# Patient Record
Sex: Male | Born: 1985 | Race: White | Hispanic: No | Smoking: Current every day smoker
Health system: Southern US, Community
[De-identification: ages and names within clinical notes are randomized; demographics above are authoritative.]

## PROBLEM LIST (undated history)

## (undated) HISTORY — PX: FEMUR FRACTURE SURGERY: SHX633

## (undated) HISTORY — PX: OTHER SURGICAL HISTORY: SHX169

---

## 2007-04-27 ENCOUNTER — Emergency Department: Payer: Self-pay | Admitting: Internal Medicine

## 2007-04-27 ENCOUNTER — Other Ambulatory Visit: Payer: Self-pay

## 2008-10-09 ENCOUNTER — Emergency Department: Payer: Self-pay | Admitting: Emergency Medicine

## 2009-03-05 ENCOUNTER — Emergency Department: Payer: Self-pay | Admitting: Emergency Medicine

## 2010-06-14 ENCOUNTER — Emergency Department: Payer: Self-pay | Admitting: Internal Medicine

## 2010-08-16 ENCOUNTER — Emergency Department: Payer: Self-pay | Admitting: Emergency Medicine

## 2012-11-21 ENCOUNTER — Emergency Department: Payer: Self-pay | Admitting: Emergency Medicine

## 2013-04-24 ENCOUNTER — Emergency Department: Payer: Self-pay | Admitting: Emergency Medicine

## 2013-05-25 ENCOUNTER — Emergency Department: Payer: Self-pay | Admitting: Emergency Medicine

## 2013-07-04 ENCOUNTER — Emergency Department: Payer: Self-pay | Admitting: Internal Medicine

## 2014-02-13 ENCOUNTER — Emergency Department: Payer: Self-pay | Admitting: Emergency Medicine

## 2014-02-14 ENCOUNTER — Emergency Department: Payer: Self-pay | Admitting: Student

## 2014-10-01 ENCOUNTER — Emergency Department
Admission: EM | Admit: 2014-10-01 | Discharge: 2014-10-01 | Disposition: A | Discharge status: Home / Self Care | Payer: BLUE CROSS/BLUE SHIELD | Attending: Emergency Medicine | Admitting: Emergency Medicine

## 2014-10-01 ENCOUNTER — Encounter: Payer: Self-pay | Admitting: Emergency Medicine

## 2014-10-01 DIAGNOSIS — Z720 Tobacco use: Secondary | ICD-10-CM | POA: Insufficient documentation

## 2014-10-01 DIAGNOSIS — K047 Periapical abscess without sinus: Secondary | ICD-10-CM | POA: Insufficient documentation

## 2014-10-01 DIAGNOSIS — K088 Other specified disorders of teeth and supporting structures: Secondary | ICD-10-CM | POA: Diagnosis present

## 2014-10-01 MED ORDER — IBUPROFEN 800 MG PO TABS: 800.0000 mg | ORAL_TABLET | Freq: Three times a day (TID) | ORAL | Status: DC | PRN

## 2014-10-01 MED ORDER — OXYCODONE-ACETAMINOPHEN 5-325 MG PO TABS: 1.0000 | ORAL_TABLET | ORAL | Status: DC | PRN

## 2014-10-01 MED ORDER — OXYCODONE-ACETAMINOPHEN 5-325 MG PO TABS
ORAL_TABLET | ORAL | Status: AC
  Filled 2014-10-01: qty 1

## 2014-10-01 MED ORDER — OXYCODONE-ACETAMINOPHEN 5-325 MG PO TABS
1.0000 | ORAL_TABLET | Freq: Once | ORAL | Status: AC
  Administered 2014-10-01: 1 via ORAL

## 2014-10-01 MED ORDER — CEFTRIAXONE SODIUM 1 G IJ SOLR
INTRAMUSCULAR | Status: AC
  Filled 2014-10-01: qty 10

## 2014-10-01 MED ORDER — AMOXICILLIN 500 MG PO TABS: 500.0000 mg | ORAL_TABLET | Freq: Three times a day (TID) | ORAL | Status: DC

## 2014-10-01 MED ORDER — LIDOCAINE VISCOUS 2 % MT SOLN: 20.0000 mL | OROMUCOSAL | Status: DC | PRN

## 2014-10-01 MED ORDER — CEFTRIAXONE SODIUM 1 G IJ SOLR
500.0000 mg | Freq: Once | INTRAMUSCULAR | Status: AC
  Administered 2014-10-01: 500 mg via INTRAMUSCULAR

## 2014-10-01 NOTE — ED Notes (Addendum)
Pt reports that he has an abscessed tooth. He also states that he has a fever, states that his calcium is low and that is why his teeth are always rotting out. No swelling seen.

## 2014-10-01 NOTE — ED Provider Notes (Signed)
[facility] Emergency Department Provider Note  ____________________________________________  Time seen:----------------------------------------- 12:26 PM on 10/01/2014 -----------------------------------------    I have reviewed the triage vital signs and the nursing notes.   HISTORY  Chief Complaint Dental Pain    HPI Trevor George is a 29 y.o. male who presents with right lower dental pain. History of calcium deficiency had similar episodes about a year ago. Reports temperature of 103 yesterday. None now temperature sensitive to both heat and cold.Patient states calcium deficiency is the reason for his teeth look in the way to do.   No past medical history on file.  There are no active problems to display for this patient.   No past surgical history on file.  Current Outpatient Rx  Name  Route  Sig  Dispense  Refill  . amoxicillin (AMOXIL) 500 MG tablet   Oral   Take 1 tablet (500 mg total) by mouth 3 (three) times daily.   30 tablet   0   . ibuprofen (ADVIL,MOTRIN) 800 MG tablet   Oral   Take 1 tablet (800 mg total) by mouth every 8 (eight) hours as needed.   30 tablet   0   . lidocaine (XYLOCAINE) 2 % solution   Mouth/Throat   Use as directed 20 mLs in the mouth or throat as needed for mouth pain.   100 mL   0   . oxyCODONE-acetaminophen (ROXICET) 5-325 MG per tablet   Oral   Take 1-2 tablets by mouth every 4 (four) hours as needed for severe pain.   15 tablet   0     Allergies Review of patient's allergies indicates no known allergies.  No family history on file.  Social History History  Substance Use Topics  . Smoking status: Current Every Day Smoker  . Smokeless tobacco: Not on file  . Alcohol Use: Yes    Review of Systems Constitutional: Positive fever/chills ENT: No sore throat. Cardiovascular: Denies chest pain. Respiratory: Denies shortness of breath. Gastrointestinal: No abdominal pain.  No nausea,  no vomiting.  No diarrhea.  No constipation. Musculoskeletal: Negative for back pain. Skin: Negative for rash. Neurological: Negative for headaches, focal weakness or numbness.  10-point ROS otherwise negative.  ____________________________________________   PHYSICAL EXAM:  VITAL SIGNS: ED Triage Vitals  Enc Vitals Group     BP --      Pulse --      Resp --      Temp --      Temp src --      SpO2 --      Weight 10/01/14 1106 141 lb (63.957 kg)     Height 10/01/14 1106 5\' 3"  (1.6 m)     Head Cir --      Peak Flow --      Pain Score 10/01/14 1107 8     Pain Loc --      Pain Edu? --      Excl. in GC? --     Constitutional: Alert and oriented. Well appearing and in no acute distress. Eyes: Conjunctivae are normal. PERRL. EOMI. Head: Atraumatic. Nose: No congestion/rhinnorhea. Mouth/Throat: Mucous membranes are moist.  Oropharynx non-erythematous. Entire mouth filled with dental caries fractured teeth rotting teeth. Neurologic:  Normal speech and language. No gross focal neurologic deficits are appreciated. Speech is normal. No gait instability. Skin:  Skin is warm, dry and intact. No rash noted. Psychiatric: Mood and affect are normal. Speech and behavior are normal.  ____________________________________________  LABS (all labs ordered are listed, but only abnormal results are displayed)  Labs Reviewed - No data to display ____________________________________________  EKG  Not applicable ____________________________________________  RADIOLOGY  Not applicable ____________________________________________   PROCEDURES  Procedure(s) performed: None  Critical Care performed: No  ____________________________________________   INITIAL IMPRESSION / ASSESSMENT AND PLAN / ED COURSE  Pertinent labs & imaging results that were available during my care of the patient were reviewed by me and considered in my medical decision making (see chart for  details).  Discussed findings with patient and course of treatment. Patient will return to the ER if symptoms worsen or indications of an increase abscess noted. Will follow up with dental as directed. ____________________________________________   FINAL CLINICAL IMPRESSION(S) / ED DIAGNOSES  Final diagnoses:  Dental abscess      Evangeline Dakinharles M Markail Diekman, PA-C 10/01/14 1233  Myrna Blazeravid Matthew Schaevitz, MD 10/01/14 [id number]

## 2014-10-01 NOTE — ED Notes (Signed)
Pt d/c but placed on med hold until 1300. Pt made aware and verbalized understanding at this time.

## 2014-10-01 NOTE — Discharge Instructions (Signed)
Dental Abscess °A dental abscess is a collection of infected fluid (pus) from a bacterial infection in the inner part of the tooth (pulp). It usually occurs at the end of the tooth's root.  °CAUSES  °· Severe tooth decay. °· Trauma to the tooth that allows bacteria to enter into the pulp, such as a broken or chipped tooth. °SYMPTOMS  °· Severe pain in and around the infected tooth. °· Swelling and redness around the abscessed tooth or in the mouth or face. °· Tenderness. °· Pus drainage. °· Bad breath. °· Bitter taste in the mouth. °· Difficulty swallowing. °· Difficulty opening the mouth. °· Nausea. °· Vomiting. °· Chills. °· Swollen neck glands. °DIAGNOSIS  °· A medical and dental history will be taken. °· An examination will be performed by tapping on the abscessed tooth. °· X-rays may be taken of the tooth to identify the abscess. °TREATMENT °The goal of treatment is to eliminate the infection. You may be prescribed antibiotic medicine to stop the infection from spreading. A root canal may be performed to save the tooth. If the tooth cannot be saved, it may be pulled (extracted) and the abscess may be drained.  °HOME CARE INSTRUCTIONS °· Only take over-the-counter or prescription medicines for pain, fever, or discomfort as directed by your caregiver. °· Rinse your mouth (gargle) often with salt water (¼ tsp salt in 8 oz [250 ml] of warm water) to relieve pain or swelling. °· Do not drive after taking pain medicine (narcotics). °· Do not apply heat to the outside of your face. °· Return to your dentist for further treatment as directed. °SEEK MEDICAL CARE IF: °· Your pain is not helped by medicine. °· Your pain is getting worse instead of better. °SEEK IMMEDIATE MEDICAL CARE IF: °· You have a fever or persistent symptoms for more than 2-3 days. °· You have a fever and your symptoms suddenly get worse. °· You have chills or a very bad headache. °· You have problems breathing or swallowing. °· You have trouble  opening your mouth. °· You have swelling in the neck or around the eye. °Document Released: 05/10/2005 Document Revised: 02/02/2012 Document Reviewed: 08/18/2010 °ExitCare® Patient Information ©2015 [employer]. This information is not intended to replace advice given to you by your health care provider. Make sure you discuss any questions you have with your health care provider. ° ° °OPTIONS FOR DENTAL FOLLOW UP CARE ° °[place] [employer] - Local Safety Net Dental Clinics °http://www.ncdhhs.gov/dph/oralhealth/services/safetynetclinics.htm °  °[facility] (336-562-3123) ° °[facility] [place] (919-933-9087) ° °[facility] (919-663-1744 ext 237) ° °[facility] (336-570-6415) ° °[facility] (919-968-2025) °This clinic caters to the indigent population and is on a lottery system. °Location: °[facility], [address], [place] °Clinic Hours: °Wednesdays from 6pm - 9pm, patients seen by a lottery system. °For dates, call or go to www.med.[facility].edu/[facility]/patients/Dental-[facility] °Services: °Cleanings, fillings and simple extractions. °Payment Options: °DENTAL WORK IS FREE OF CHARGE. Bring proof of income or support. °Best way to get seen: °Arrive at 5:15 pm - this is a lottery, NOT first come/first serve, so arriving earlier will not increase your chances of being seen. °  °  °[facility] °919-537-3737 °Select option 1 for emergencies °  °Location: °[facility], [address], [place] °Clinic Hours: °No walk-ins accepted - call the day before to schedule an appointment. °Check in times   are 9:30 am and 1:30 pm. °Services: °Simple extractions, temporary fillings, pulpectomy/pulp debridement, uncomplicated abscess drainage. °Payment Options: °PAYMENT IS DUE AT THE TIME OF SERVICE.  Fee is usually $100-200, additional surgical procedures (e.g. abscess drainage) may  be extra. °Cash, checks, Visa/MasterCard accepted.  Can file Medicaid if patient is covered for dental - patient should call case worker to check. °No discount for [facility] patients. °Best way to get seen: °MUST call the day before and get onto the schedule. Can usually be seen the next 1-2 days. No walk-ins accepted. °  °  °[facility] °919-933-9087 °  °Location: °[facility], [address], [place] °Clinic Hours: °M, W, Th, F 8am or 1:30pm, Tues 9a or 1:30 - first come/first served. °Services: °Simple extractions, temporary fillings, uncomplicated abscess drainage.  You do not need to be an [place] resident. °Payment Options: °PAYMENT IS DUE AT THE TIME OF SERVICE. °Dental insurance, otherwise sliding scale - bring proof of income or support. °Depending on income and treatment needed, cost is usually $50-200. °Best way to get seen: °Arrive early as it is first come/first served. °  °  °[facility] °919-542-1641 °  °Location: °[address] °Clinic Hours: °Mon-Thu 8a-5p °Services: °Most basic dental services including extractions and fillings. °Payment Options: °PAYMENT IS DUE AT THE TIME OF SERVICE. °Sliding scale, up to 50% off - bring proof if income or support. °Medicaid with dental option accepted. °Best way to get seen: °Call to schedule an appointment, can usually be seen within 2 weeks OR they will try to see walk-ins - show up at 8a or 2p (you may have to wait). °  °  °[facility] °919-245-2435 °[place] RESIDENTS ONLY °  °Location: °[facility], [address], [place], [place] [address] °Clinic Hours: By appointment only. °Monday - Thursday 8am-5pm, Friday 8am-12pm °Services: Cleanings, fillings, extractions. °Payment Options: °PAYMENT IS DUE AT THE TIME OF SERVICE. °Cash, Visa or MasterCard. Sliding scale - $30 minimum per service. °Best way to get seen: °Come in to  office, complete packet and make an appointment - need proof of income °or support monies for each household member and proof of [place] residence. °Usually takes about a month to get in. °  °  °[facility] °919-956-4038 °  °Location: °[address] [place] °Clinic Hours: Walk-in Urgent Care Dental Services are offered Monday-Friday mornings only. °The numbers of emergencies accepted daily is limited to the number of °providers available. °Maximum 15 - Mondays, Wednesdays & Thursdays °Maximum 10 - Tuesdays & Fridays °Services: °You do not need to be a [place] resident to be seen for a dental emergency. °Emergencies are defined as pain, swelling, abnormal bleeding, or dental trauma. Walkins will receive x-rays if needed. °NOTE: Dental cleaning is not an emergency. °Payment Options: °PAYMENT IS DUE AT THE TIME OF SERVICE. °Minimum co-pay is $40.00 for uninsured patients. °Minimum co-pay is $3.00 for Medicaid with dental coverage. °Dental Insurance is accepted and must be presented at time of visit. °Medicare does not cover dental. °Forms of payment: Cash, credit card, checks. °Best way to get seen: °If not previously registered with the clinic, walk-in dental registration begins at 7:15 am and is on a first come/first serve basis. °If previously registered with the clinic, call to make an appointment. °  °  °[facility] °919-776-4359 °[place] RESIDENTS ONLY °  °Location: °[address],   [place], [place] °Clinic Hours: °Mon-Thu 10a-2p °Services: Extractions only! °Payment Options: °FREE (donations accepted) - bring proof of income or support °Best way to get seen: °Call and schedule an appointment OR come at 8am on the 1st Monday of every month (except for holidays) when it is first come/first served. °  °  °[facility] °919-250-2952 °  °Location: °[address], [place] °Clinic Hours: °Friday mornings °Services, Payment Options, Best way to get  seen: °Call for info °

## 2014-12-04 ENCOUNTER — Encounter: Payer: Self-pay | Admitting: Emergency Medicine

## 2014-12-04 ENCOUNTER — Emergency Department
Admission: EM | Admit: 2014-12-04 | Discharge: 2014-12-04 | Disposition: A | Discharge status: Home / Self Care | Payer: BLUE CROSS/BLUE SHIELD | Attending: Emergency Medicine | Admitting: Emergency Medicine

## 2014-12-04 DIAGNOSIS — G8929 Other chronic pain: Secondary | ICD-10-CM | POA: Diagnosis not present

## 2014-12-04 DIAGNOSIS — Z720 Tobacco use: Secondary | ICD-10-CM | POA: Insufficient documentation

## 2014-12-04 DIAGNOSIS — K088 Other specified disorders of teeth and supporting structures: Secondary | ICD-10-CM | POA: Diagnosis not present

## 2014-12-04 DIAGNOSIS — K089 Disorder of teeth and supporting structures, unspecified: Secondary | ICD-10-CM

## 2014-12-04 DIAGNOSIS — K0889 Other specified disorders of teeth and supporting structures: Secondary | ICD-10-CM

## 2014-12-04 MED ORDER — LIDOCAINE VISCOUS 2 % MT SOLN: OROMUCOSAL | Status: DC

## 2014-12-04 MED ORDER — PENICILLIN V POTASSIUM 500 MG PO TABS: 500.0000 mg | ORAL_TABLET | Freq: Four times a day (QID) | ORAL | Status: DC

## 2014-12-04 MED ORDER — KETOROLAC TROMETHAMINE 10 MG PO TABS: 10.0000 mg | ORAL_TABLET | Freq: Four times a day (QID) | ORAL | Status: DC | PRN

## 2014-12-04 MED ORDER — TRAMADOL HCL 50 MG PO TABS: 50.0000 mg | ORAL_TABLET | Freq: Four times a day (QID) | ORAL | Status: DC | PRN

## 2014-12-04 NOTE — ED Notes (Signed)
States he developed a tooth abscess this week

## 2014-12-04 NOTE — ED Provider Notes (Signed)
[facility] Emergency Department Provider Note  ____________________________________________  Time seen: Approximately 12:11 PM  I have reviewed the triage vital signs and the nursing notes.   HISTORY  Chief Complaint Dental Pain   HPI Trevor George is a 29 y.o. male is here today with complaint of dental pain. He states that he developed "an abscess". He states this is same tooth that is bothering him since May. He did not follow-up with a dentist. Today he states he hasn't point met with a dentist on Friday but cannot remember the dentist's name or where the dentist office is. Currently pain is 5 out of 10. He has not taken anything at home that has helped his pain.   History reviewed. No pertinent past medical history.  There are no active problems to display for this patient.   Past Surgical History  Procedure Laterality Date  . Left knee replacement    . Femur fracture surgery Left     Current Outpatient Rx  Name  Route  Sig  Dispense  Refill  . ketorolac (TORADOL) 10 MG tablet   Oral   Take 1 tablet (10 mg total) by mouth every 6 (six) hours as needed.   12 tablet   0   . lidocaine (XYLOCAINE) 2 % solution      Apply on cotton ball and place on area of dental pain prn   30 mL   0   . penicillin v potassium (VEETID) 500 MG tablet   Oral   Take 1 tablet (500 mg total) by mouth 4 (four) times daily.   28 tablet   0   . traMADol (ULTRAM) 50 MG tablet   Oral   Take 1 tablet (50 mg total) by mouth every 6 (six) hours as needed.   20 tablet   0     Allergies Morphine and related  No family history on file.  Social History History  Substance Use Topics  . Smoking status: Current Every Day Smoker  . Smokeless tobacco: Not on file  . Alcohol Use: Yes    Review of Systems Constitutional: No fever/chills ENT: No sore throat. Cardiovascular: Denies chest pain. Respiratory: Denies shortness of breath. Gastrointestinal:  No abdominal pain.  No nausea, no vomiting. Genitourinary: Negative for dysuria. Musculoskeletal: Negative for back pain. Skin: Negative for rash. Neurological: Negative for headaches  10-point ROS otherwise negative.  ____________________________________________   PHYSICAL EXAM:  VITAL SIGNS: ED Triage Vitals  Enc Vitals Group     BP 12/04/14 1108 117/72 mmHg     Pulse Rate 12/04/14 1108 84     Resp 12/04/14 1108 18     Temp 12/04/14 1108 98 F (36.7 C)     Temp Source 12/04/14 1108 Oral     SpO2 12/04/14 1108 99 %     Weight 12/04/14 1108 169 lb (76.658 kg)     Height 12/04/14 1108 _0  (1.753 m)     Head Cir --      Peak Flow --      Pain Score 12/04/14 1110 5     Pain Loc --      Pain Edu? --      Excl. in [place]? --     Constitutional: Alert and oriented. Well appearing and in no acute distress. Eyes: Conjunctivae are normal. PERRL. EOMI. Head: Atraumatic. Nose: No congestion/rhinnorhea.  Examination of the mouth reveals multiple black teeth especially on the right lower molars. There is no gum edema  or obvious abscess. The enamel is completely eroded from most of his teeth. Neck: No stridor.  Supple Hematological/Lymphatic/Immunilogical: No cervical lymphadenopathy. Cardiovascular: Normal rate, regular rhythm. Grossly normal heart sounds.  Good peripheral circulation. Respiratory: Normal respiratory effort.  No retractions. Lungs CTAB. Gastrointestinal: Soft and nontender. No distention. Musculoskeletal: No lower extremity tenderness nor edema.  No joint effusions. Neurologic:  Normal speech and language. No gross focal neurologic deficits are appreciated. No gait instability. Skin:  Skin is warm, dry and intact. No rash noted. Psychiatric: Mood and affect are normal. Speech and behavior are normal.  ____________________________________________   LABS (all labs ordered are listed, but only abnormal results are displayed)  Labs Reviewed - No data to  display  PROCEDURES  Procedure(s) performed: None  Critical Care performed: No  ____________________________________________   INITIAL IMPRESSION / ASSESSMENT AND PLAN / ED COURSE  Pertinent labs & imaging results that were available during my care of the patient were reviewed by me and considered in my medical decision making (see chart for details).  Patient was given a prescription for Toradol tablets, lidocaine, Pen-Vee K, and Toradol No. 20. Patient is currently just to keep his appointment with his dentist on Friday. ____________________________________________   FINAL CLINICAL IMPRESSION(S) / ED DIAGNOSES  Final diagnoses:  Pain, dental  Chronic dental pain      Johnn Hai, PA-C 12/04/14 1607  Delman Kitten, MD 12/09/14 [phone]

## 2014-12-04 NOTE — Discharge Instructions (Signed)
° °  KEEP YOUR APPOINTMENT THIS Friday WITH YOUR DENTIST

## 2015-01-27 ENCOUNTER — Emergency Department
Admission: EM | Admit: 2015-01-27 | Discharge: 2015-01-27 | Disposition: A | Discharge status: Home / Self Care | Payer: BLUE CROSS/BLUE SHIELD | Attending: Emergency Medicine | Admitting: Emergency Medicine

## 2015-01-27 ENCOUNTER — Encounter: Payer: Self-pay | Admitting: *Deleted

## 2015-01-27 DIAGNOSIS — K047 Periapical abscess without sinus: Secondary | ICD-10-CM | POA: Insufficient documentation

## 2015-01-27 DIAGNOSIS — K029 Dental caries, unspecified: Secondary | ICD-10-CM | POA: Insufficient documentation

## 2015-01-27 DIAGNOSIS — Z720 Tobacco use: Secondary | ICD-10-CM | POA: Insufficient documentation

## 2015-01-27 MED ORDER — TRAMADOL HCL 50 MG PO TABS: 50.0000 mg | ORAL_TABLET | Freq: Two times a day (BID) | ORAL | Status: DC

## 2015-01-27 MED ORDER — PENICILLIN V POTASSIUM 500 MG PO TABS
500.0000 mg | ORAL_TABLET | Freq: Four times a day (QID) | ORAL | Status: DC
  Administered 2015-01-27: 500 mg via ORAL
  Filled 2015-01-27: qty 1

## 2015-01-27 MED ORDER — PENICILLIN V POTASSIUM 500 MG PO TABS: 500.0000 mg | ORAL_TABLET | Freq: Four times a day (QID) | ORAL | Status: DC

## 2015-01-27 MED ORDER — KETOROLAC TROMETHAMINE 10 MG PO TABS: 10.0000 mg | ORAL_TABLET | Freq: Three times a day (TID) | ORAL | Status: DC

## 2015-01-27 MED ORDER — KETOROLAC TROMETHAMINE 60 MG/2ML IM SOLN
60.0000 mg | Freq: Once | INTRAMUSCULAR | Status: AC
  Administered 2015-01-27: 60 mg via INTRAMUSCULAR
  Filled 2015-01-27: qty 2

## 2015-01-27 NOTE — ED Notes (Signed)
Pt complains of right sided mouth/ tooth pain, pt has swelling to right side of face

## 2015-01-27 NOTE — Discharge Instructions (Signed)
Dental Abscess °A dental abscess is a collection of infected fluid (pus) from a bacterial infection in the inner part of the tooth (pulp). It usually occurs at the end of the tooth's root.  °CAUSES  °· Severe tooth decay. °· Trauma to the tooth that allows bacteria to enter into the pulp, such as a broken or chipped tooth. °SYMPTOMS  °· Severe pain in and around the infected tooth. °· Swelling and redness around the abscessed tooth or in the mouth or face. °· Tenderness. °· Pus drainage. °· Bad breath. °· Bitter taste in the mouth. °· Difficulty swallowing. °· Difficulty opening the mouth. °· Nausea. °· Vomiting. °· Chills. °· Swollen neck glands. °DIAGNOSIS  °· A medical and dental history will be taken. °· An examination will be performed by tapping on the abscessed tooth. °· X-rays may be taken of the tooth to identify the abscess. °TREATMENT °The goal of treatment is to eliminate the infection. You may be prescribed antibiotic medicine to stop the infection from spreading. A root canal may be performed to save the tooth. If the tooth cannot be saved, it may be pulled (extracted) and the abscess may be drained.  °HOME CARE INSTRUCTIONS °· Only take over-the-counter or prescription medicines for pain, fever, or discomfort as directed by your caregiver. °· Rinse your mouth (gargle) often with salt water (¼ tsp salt in 8 oz [250 ml] of warm water) to relieve pain or swelling. °· Do not drive after taking pain medicine (narcotics). °· Do not apply heat to the outside of your face. °· Return to your dentist for further treatment as directed. °SEEK MEDICAL CARE IF: °· Your pain is not helped by medicine. °· Your pain is getting worse instead of better. °SEEK IMMEDIATE MEDICAL CARE IF: °· You have a fever or persistent symptoms for more than 2-3 days. °· You have a fever and your symptoms suddenly get worse. °· You have chills or a very bad headache. °· You have problems breathing or swallowing. °· You have trouble  opening your mouth. °· You have swelling in the neck or around the eye. °Document Released: 05/10/2005 Document Revised: 02/02/2012 Document Reviewed: 08/18/2010 °ExitCare® Patient Information ©2015 [employer]. This information is not intended to replace advice given to you by your health care provider. Make sure you discuss any questions you have with your health care provider. ° °Dental Care and Dentist Visits °Dental care supports good overall health. Regular dental visits can also help you avoid dental pain, bleeding, infection, and other more serious health problems in the future. It is important to keep the mouth healthy because diseases in the teeth, gums, and other oral tissues can spread to other areas of the body. Some problems, such as diabetes, heart disease, and pre-term labor have been associated with poor oral health.  °See your dentist every 6 months. If you experience emergency problems such as a toothache or broken tooth, go to the dentist right away. If you see your dentist regularly, you may catch problems early. It is easier to be treated for problems in the early stages.  °WHAT TO EXPECT AT A DENTIST VISIT  °Your dentist will look for many common oral health problems and recommend proper treatment. At your regular dental visit, you can expect: °· Gentle cleaning of the teeth and gums. This includes scraping and polishing. This helps to remove the sticky substance around the teeth and gums (plaque). Plaque forms in the mouth shortly after eating. Over time, plaque hardens   on the teeth as tartar. If tartar is not removed regularly, it can cause problems. Cleaning also helps remove stains.  Periodic X-rays. These pictures of the teeth and supporting bone will help your dentist assess the health of your teeth.  Periodic fluoride treatments. Fluoride is a natural mineral shown to help strengthen teeth. Fluoride treatmentinvolves applying a fluoride gel or varnish to the teeth. It is most  commonly done in children.  Examination of the mouth, tongue, jaws, teeth, and gums to look for any oral health problems, such as:  Cavities (dental caries). This is decay on the tooth caused by plaque, sugar, and acid in the mouth. It is best to catch a cavity when it is small.  Inflammation of the gums caused by plaque buildup (gingivitis).  Problems with the mouth or malformed or misaligned teeth.  Oral cancer or other diseases of the soft tissues or jaws. KEEP YOUR TEETH AND GUMS HEALTHY For healthy teeth and gums, follow these general guidelines as well as your dentist's specific advice:  Have your teeth professionally cleaned at the dentist every 6 months.  Brush twice daily with a fluoride toothpaste.  Floss your teeth daily.  Ask your dentist if you need fluoride supplements, treatments, or fluoride toothpaste.  Eat a healthy diet. Reduce foods and drinks with added sugar.  Avoid smoking. TREATMENT FOR ORAL HEALTH PROBLEMS If you have oral health problems, treatment varies depending on the conditions present in your teeth and gums.  Your caregiver will most likely recommend good oral hygiene at each visit.  For cavities, gingivitis, or other oral health disease, your caregiver will perform a procedure to treat the problem. This is typically done at a separate appointment. Sometimes your caregiver will refer you to another dental specialist for specific tooth problems or for surgery. SEEK IMMEDIATE DENTAL CARE IF:  You have pain, bleeding, or soreness in the gum, tooth, jaw, or mouth area.  A permanent tooth becomes loose or separated from the gum socket.  You experience a blow or injury to the mouth or jaw area. Document Released: 01/20/2011 Document Revised: 08/02/2011 Document Reviewed: 01/20/2011 [facility] Patient Information 2015 [place], [place]. This information is not intended to replace advice given to you by your health care provider. Make sure you discuss any  questions you have with your health care provider.  Take the prescription meds as directed. Take the antibiotic until completely gone.  Follow-up with one of the dental clinics listed below.  OPTIONS FOR DENTAL FOLLOW UP CARE  [place] Department of Health and Human Services - Local Safety Net Dental Clinics TripDoors.com.htm   [facility] [phone])  Sharl Ma [phone])  [facility] [phone])  [facility] [phone])  [facility] [phone]) This clinic caters to the indigent population and is on a lottery system. Location: [employer] [facility], [employer], [address], [place] Clinic Hours: Wednesdays from 6pm - 9pm, patients seen by a lottery system. For dates, call or go to [email or web address] Services: Cleanings, fillings and simple extractions. Payment Options: DENTAL WORK IS FREE OF CHARGE. Bring proof of income or support. Best way to get seen: Arrive at 5:15 pm - this is a lottery, NOT first come/first serve, so arriving earlier will not increase your chances of being seen.     [facility] [phone] Select option 1 for emergencies   Location: [facility], [place], [address], [place] Clinic Hours:  No walk-ins accepted - call the day before to schedule an appointment. Check in times are 9:30 am and 1:30 pm. Services: Simple extractions, temporary fillings, pulpectomy/pulp debridement, uncomplicated abscess drainage. Payment Options: PAYMENT IS DUE AT THE TIME OF SERVICE.  Fee is usually $100-200, additional surgical procedures (e.g. abscess drainage) may be extra. Cash, checks, Visa/MasterCard accepted.  Can file Medicaid if patient is covered for dental - patient should call case worker to check. No discount for [facility]  patients. Best way to get seen: MUST call the day before and get onto the schedule. Can usually be seen the next 1-2 days. No walk-ins accepted.     [facility] [phone]   Location: [facility], [address] [place] Clinic Hours: M, W, Th, F 8am or 1:30pm, Tues 9a or 1:30 - first come/first served. Services: Simple extractions, temporary fillings, uncomplicated abscess drainage.  You do not need to be an [facility] resident. Payment Options: PAYMENT IS DUE AT THE TIME OF SERVICE. Dental insurance, otherwise sliding scale - bring proof of income or support. Depending on income and treatment needed, cost is usually $50-200. Best way to get seen: Arrive early as it is first come/first served.     [facility] [phone]   Location: [address] Clinic Hours: Mon-Thu 8a-5p Services: Most basic dental services including extractions and fillings. Payment Options: PAYMENT IS DUE AT THE TIME OF SERVICE. Sliding scale, up to 50% off - bring proof if income or support. Medicaid with dental option accepted. Best way to get seen: Call to schedule an appointment, can usually be seen within 2 weeks OR they will try to see walk-ins - show up at 8a or 2p (you may have to wait).     [facility] [phone] [place]ID[place] ONLY   Location: [facility], [address] [place], [place] [address] Clinic Hours: By appointment only. Monday - Thursday 8am-5pm, Friday 8am-12pm Services: Cleanings, fillings, extractions. Payment Options: PAYMENT IS DUE AT THE TIME OF SERVICE. Cash, Visa or MasterCard. Sliding scale - $30 minimum per service. Best way to get seen: Come in to office, complete packet and make an appointment - need proof of income or support monies for each household member and proof of [facility] residence. Usually takes about a month to  get in.     [facility] [phone]   Location: [address] [facility] Clinic Hours: Walk-in Urgent Care Dental Services are offered Monday-Friday mornings only. The numbers of emergencies accepted daily is limited to the number of providers available. Maximum 15 - Mondays, Wednesdays & Thursdays Maximum 10 - Tuesdays & Fridays Services: You do not need to be a [facility] resident to be seen for a dental emergency. Emergencies are defined as pain, swelling, abnormal bleeding, or dental trauma. Walkins will receive x-rays if needed. NOTE: Dental cleaning is not an emergency. Payment Options: PAYMENT IS DUE AT THE TIME OF SERVICE. Minimum co-pay is $40.00 for uninsured patients. Minimum co-pay is $3.00 for Medicaid with dental coverage. Dental Insurance is accepted and must be presented at time of visit. Medicare does not cover dental. Forms of payment: Cash, credit card, checks. Best way to get seen: If not previously registered with the clinic, walk-in dental registration begins at 7:15 am and is on a first come/first serve basis. If previously registered with the clinic, call to make an appointment.     [facility] [phone] [place]IDENTS ONLY   Location: [address] [place], [place] Clinic Hours: Mon-Thu 10a-2p Services: Extractions only! Payment Options: FREE (donations accepted) - bring proof of income or support Best way to get seen: Call and schedule an appointment OR come at 8am on the 1st Monday of every month (except for holidays) when it is first come/first served.     [facility] [phone]   Location: [address] [place], [place] Clinic Hours: Friday mornings Services, Payment Options, Best way to get seen: Call for info

## 2015-01-27 NOTE — ED Provider Notes (Signed)
[facility] Emergency Department Provider Note ____________________________________________  Time seen: 1235  I have reviewed the triage vital signs and the nursing notes.  HISTORY  Chief Complaint  Dental Pain  HPI Trevor George is a 29 y.o. male reports to the ED for the second time in as many months for acute dental abscess due to chronic dental decay, gum disease, and gingivitis. He notes over the last 3-4 days he's had increasing swelling to the lower right jaw as well as some pain on that side. He denies any outright fevers, chills, sweats, or nausea. He reports that he is unable to get in to the [facility] dental [facility] due to a 4-year waiting list.  History reviewed. No pertinent past medical history.  There are no active problems to display for this patient.  Past Surgical History  Procedure Laterality Date  . Left knee replacement    . Femur fracture surgery Left     Current Outpatient Rx  Name  Route  Sig  Dispense  Refill  . ketorolac (TORADOL) 10 MG tablet   Oral   Take 1 tablet (10 mg total) by mouth every 8 (eight) hours.   15 tablet   0   . lidocaine (XYLOCAINE) 2 % solution      Apply on cotton ball and place on area of dental pain prn   30 mL   0   . penicillin v potassium (VEETID) 500 MG tablet   Oral   Take 1 tablet (500 mg total) by mouth 4 (four) times daily.   40 tablet   0   . traMADol (ULTRAM) 50 MG tablet   Oral   Take 1 tablet (50 mg total) by mouth 2 (two) times daily.   10 tablet   0    Allergies Morphine and related  No family history on file.  Social History Social History  Substance Use Topics  . Smoking status: Current Every Day Smoker  . Smokeless tobacco: None  . Alcohol Use: Yes   Review of Systems  Constitutional: Negative for fever. Eyes: Negative for visual changes. ENT: Negative for sore throat. Dental pain and swelling as above. Cardiovascular: Negative for chest pain. Respiratory:  Negative for shortness of breath. Gastrointestinal: Negative for abdominal pain, vomiting and diarrhea. Genitourinary: Negative for dysuria. Musculoskeletal: Negative for back pain. Skin: Negative for rash. Neurological: Negative for headaches, focal weakness or numbness. ____________________________________________  PHYSICAL EXAM:  VITAL SIGNS: ED Triage Vitals  Enc Vitals Group     BP 01/27/15 1157 138/72 mmHg     Pulse Rate 01/27/15 1157 61     Resp 01/27/15 1157 18     Temp 01/27/15 1157 98.2 F (36.8 C)     Temp Source 01/27/15 1157 Oral     SpO2 01/27/15 1157 99 %     Weight 01/27/15 1157 173 lb (78.472 kg)     Height 01/27/15 1157  (1.753 m)     Head Cir --      Peak Flow --      Pain Score 01/27/15 1214 8     Pain Loc --      Pain Edu? --      Excl. in GC? --    Constitutional: Alert and oriented. Well appearing and in no distress. Eyes: Conjunctivae are normal. PERRL. Normal extraocular movements. ENT   Head: Normocephalic and atraumatic.   Nose: No congestion/rhinorrhea.   Mouth/Throat: Mucous membranes are moist. Right lower jaw swelling. Global dental  enamel, and dentin decay and gingivitis.    Neck: Supple. No thyromegaly. Hematological/Lymphatic/Immunological: No cervical lymphadenopathy. Cardiovascular: Normal rate, regular rhythm.  Respiratory: Normal respiratory effort. No wheezes/rales/rhonchi. Gastrointestinal: Soft and nontender. No distention. Musculoskeletal: Nontender with normal range of motion in all extremities.  Neurologic:  Normal gait without ataxia. Normal speech and language. No gross focal neurologic deficits are appreciated. Skin:  Skin is warm, dry and intact. No rash noted. Psychiatric: Mood and affect are normal. Patient exhibits appropriate insight and judgment. ____________________________________________  PROCEDURES  Toradol 60 mg IM Pen VK 500 mg PO ____________________________________________  INITIAL  IMPRESSION / ASSESSMENT AND PLAN / ED COURSE  Treatment for acute dental abscess in the presence of chronic dental decay and gingivitis. We will treat with Ultram, pen VK, and ketorolac. Patient is given the dental clinic provider list for further follow-up. ____________________________________________  FINAL CLINICAL IMPRESSION(S) / ED DIAGNOSES  Final diagnoses:  Dental abscess  Dental cavities     Lissa Hoard, PA-C 01/27/15 1410  Jennye Moccasin, MD 01/27/15 1434

## 2015-05-04 ENCOUNTER — Encounter: Payer: Self-pay | Admitting: Emergency Medicine

## 2015-05-04 ENCOUNTER — Emergency Department
Admission: EM | Admit: 2015-05-04 | Discharge: 2015-05-04 | Disposition: A | Discharge status: Home / Self Care | Payer: BLUE CROSS/BLUE SHIELD | Attending: Emergency Medicine | Admitting: Emergency Medicine

## 2015-05-04 DIAGNOSIS — K029 Dental caries, unspecified: Secondary | ICD-10-CM | POA: Insufficient documentation

## 2015-05-04 DIAGNOSIS — F17200 Nicotine dependence, unspecified, uncomplicated: Secondary | ICD-10-CM | POA: Insufficient documentation

## 2015-05-04 MED ORDER — LIDOCAINE VISCOUS 2 % MT SOLN: 20.0000 mL | OROMUCOSAL | Status: DC | PRN

## 2015-05-04 MED ORDER — IBUPROFEN 800 MG PO TABS: 800.0000 mg | ORAL_TABLET | Freq: Three times a day (TID) | ORAL | Status: DC

## 2015-05-04 MED ORDER — AMOXICILLIN 500 MG PO CAPS: 500.0000 mg | ORAL_CAPSULE | Freq: Three times a day (TID) | ORAL | Status: DC

## 2015-05-04 NOTE — ED Notes (Signed)
NAD noted at time of D/C. Pt denies questions or concerns. Pt ambulatory to the lobby at this time.  

## 2015-05-04 NOTE — Discharge Instructions (Signed)
Dental Caries Dental caries is tooth decay. This decay can cause a hole in teeth (cavity) that can get bigger and deeper over time. HOME CARE  Brush and floss your teeth. Do this at least two times a day.  Use a fluoride toothpaste.  Use a mouth rinse if told by your dentist or doctor.  Eat less sugary and starchy foods. Drink less sugary drinks.  Avoid snacking often on sugary and starchy foods. Avoid sipping often on sugary drinks.  Keep regular checkups and cleanings with your dentist.  Use fluoride supplements if told by your dentist or doctor.  Allow fluoride to be applied to teeth if told by your dentist or doctor.   This information is not intended to replace advice given to you by your health care provider. Make sure you discuss any questions you have with your health care provider.   Document Released: 02/17/2008 Document Revised: 05/31/2014 Document Reviewed: 05/12/2012 Elsevier Interactive Patient Education 2016 [employer]   OPTIONS FOR DENTAL FOLLOW UP CARE  [place] Department of Health and Human Services - Local Safety Net Dental Clinics TripDoors.comhttp://www.ncdhhs.gov/dph/oralhealth/services/safetynetclinics.htm   [facility] [phone])  [facility] [phone])  [facility] [phone])  [facility] [phone])  [facility] [phone]) This clinic caters to the indigent population and is on a lottery system. Location: [employer] [facility], [employer], [address], [place] Clinic Hours: Wednesdays from 6pm - 9pm, patients seen by a lottery system. For dates, call or go to [email or web address] Services: Cleanings, fillings and simple extractions. Payment Options: DENTAL WORK IS FREE OF CHARGE. Bring proof of income or support. Best way to get seen: Arrive at 5:15 pm - this is a lottery, NOT first come/first serve, so arriving earlier will not  increase your chances of being seen.     [facility] [phone] Select option 1 for emergencies   Location: [facility], [place], [address], [place] Clinic Hours: No walk-ins accepted - call the day before to schedule an appointment. Check in times are 9:30 am and 1:30 pm. Services: Simple extractions, temporary fillings, pulpectomy/pulp debridement, uncomplicated abscess drainage. Payment Options: PAYMENT IS DUE AT THE TIME OF SERVICE.  Fee is usually $100-200, additional surgical procedures (e.g. abscess drainage) may be extra. Cash, checks, Visa/MasterCard accepted.  Can file Medicaid if patient is covered for dental - patient should call case worker to check. No discount for [facility] patients. Best way to get seen: MUST call the day before and get onto the schedule. Can usually be seen the next 1-2 days. No walk-ins accepted.     [facility] [phone]   Location: [facility], [address], [place] Clinic Hours: M, W, Th, F 8am or 1:30pm, Tues 9a or 1:30 - first come/first served. Services: Simple extractions, temporary fillings, uncomplicated abscess drainage.  You do not need to be an [place] [facility] [place] resident. Payment Options: PAYMENT IS DUE AT THE TIME OF SERVICE. Dental insurance, otherwise sliding scale - bring proof of income or support. Depending on income and treatment needed, cost is usually $50-200. Best way to get seen: Arrive early as it is first come/first served.     [facility] [phone]   Location: [address] Clinic Hours: Mon-Thu 8a-5p Services: Most basic dental services including extractions and fillings. Payment Options: PAYMENT IS DUE AT THE TIME OF SERVICE. Sliding scale, up to  50% off - bring proof if income or support. Medicaid with dental option accepted. Best way to get seen: Call to  schedule an appointment, can usually be seen within 2 weeks OR they will try to see walk-ins - show up at 8a or 2p (you may have to wait).     [facility] [phone] [place]IDENTS ONLY   Location: [facility], [address], [place], [place] [address] Clinic Hours: By appointment only. Monday - Thursday 8am-5pm, Friday 8am-12pm Services: Cleanings, fillings, extractions. Payment Options: PAYMENT IS DUE AT THE TIME OF SERVICE. Cash, Visa or MasterCard. Sliding scale - $30 minimum per service. Best way to get seen: Come in to office, complete packet and make an appointment - need proof of income or support monies for each household member and proof of [facility] residence. Usually takes about a month to get in.     [facility] [phone]   Location: [address] [facility] Clinic Hours: Walk-in Urgent Care Dental Services are offered Monday-Friday mornings only. The numbers of emergencies accepted daily is limited to the number of providers available. Maximum 15 - Mondays, Wednesdays & Thursdays Maximum 10 - Tuesdays & Fridays Services: You do not need to be a [facility] resident to be seen for a dental emergency. Emergencies are defined as pain, swelling, abnormal bleeding, or dental trauma. Walkins will receive x-rays if needed. NOTE: Dental cleaning is not an emergency. Payment Options: PAYMENT IS DUE AT THE TIME OF SERVICE. Minimum co-pay is $40.00 for uninsured patients. Minimum co-pay is $3.00 for Medicaid with dental coverage. Dental Insurance is accepted and must be presented at time of visit. Medicare does not cover dental. Forms of payment: Cash, credit card, checks. Best way to get seen: If not previously registered with the clinic, walk-in dental registration begins at 7:15 am and is on a first come/first serve basis. If previously registered with the clinic, call to make an  appointment.     [facility] [phone] [place] RESIDENTS ONLY   Location: [address], [place], [place] Clinic Hours: Mon-Thu 10a-2p Services: Extractions only! Payment Options: FREE (donations accepted) - bring proof of income or support Best way to get seen: Call and schedule an appointment OR come at 8am on the 1st Monday of every month (except for holidays) when it is first come/first served.     [facility] [phone]   Location: [address] [place], [place] Clinic Hours: Friday mornings Services, Payment Options, Best way to get seen: Call for info   You need to see a dentist in the very near future. Take all of the amoxicillin until completely gone. Ibuprofen as needed for pain and inflammation of the gums.

## 2015-05-04 NOTE — ED Notes (Signed)
Pt reports having abscess tooth.

## 2015-05-04 NOTE — ED Provider Notes (Signed)
[facility] Emergency Department Provider Note  ____________________________________________  Time seen: Approximately 9:52 AM  I have reviewed the triage vital signs and the nursing notes.   HISTORY  Chief Complaint Dental Pain   HPI Trevor George is a 29 y.o. male is here with complaint of abscessed tooth. Patient states that he began approximately 2 days ago. He states that he has not seen a dentist recently this had problems with his teeth for a long time. He is not taking any over-the-counter medication. He denies any fever or chills. He denies any nausea or vomiting.Currently he rates his pain as 10 out of 10.   No past medical history on file.  There are no active problems to display for this patient.   Past Surgical History  Procedure Laterality Date  . Left knee replacement    . Femur fracture surgery Left     Current Outpatient Rx  Name  Route  Sig  Dispense  Refill  . amoxicillin (AMOXIL) 500 MG capsule   Oral   Take 1 capsule (500 mg total) by mouth 3 (three) times daily.   30 capsule   0   . ibuprofen (ADVIL,MOTRIN) 800 MG tablet   Oral   Take 1 tablet (800 mg total) by mouth 3 (three) times daily.   30 tablet   0   . lidocaine (XYLOCAINE) 2 % solution   Mouth/Throat   Use as directed 20 mLs in the mouth or throat every 4 (four) hours as needed for mouth pain (apply to teeth on cotten ball prn pain).   100 mL   0     Allergies Morphine and related  History reviewed. No pertinent family history.  Social History Social History  Substance Use Topics  . Smoking status: Current Every Day Smoker  . Smokeless tobacco: None  . Alcohol Use: Yes    Review of Systems Constitutional: No fever/chills ENT: No sore throat. Positive dental pain Cardiovascular: Denies chest pain. Respiratory: Denies shortness of breath. Gastrointestinal:   No nausea, no vomiting.  Skin: Negative for rash. Neurological: Negative for  headaches, focal weakness or numbness.  10-point ROS otherwise negative.  ____________________________________________   PHYSICAL EXAM:  VITAL SIGNS: ED Triage Vitals  Enc Vitals Group     BP 05/04/15 0926 131/79 mmHg     Pulse Rate 05/04/15 0926 77     Resp 05/04/15 0926 16     Temp 05/04/15 0926 98.1 F (36.7 C)     Temp Source 05/04/15 0926 Oral     SpO2 05/04/15 0926 100 %     Weight --      Height --      Head Cir --      Peak Flow --      Pain Score 05/04/15 0927 10     Pain Loc --      Pain Edu? --      Excl. in GC? --     Constitutional: Alert and oriented. Well appearing and in no acute distress. Eyes: Conjunctivae are normal. PERRL. EOMI. Head: Atraumatic. Nose: No congestion/rhinnorhea. Mouth/Throat: Mucous membranes are moist.  Oropharynx non-erythematous. All teeth are in very poor dentition and extremely poor hygiene. There is some minimal gum swelling and tenderness anterior mandible. Generalized enamel loss and numerous teeth. Neck: No stridor.   Hematological/Lymphatic/Immunilogical: No cervical lymphadenopathy. Cardiovascular: Normal rate, regular rhythm. Grossly normal heart sounds.  Good peripheral circulation. Respiratory: Normal respiratory effort.  No retractions. Lungs CTAB. Gastrointestinal: Soft  and nontender. No distention Musculoskeletal: No lower extremity tenderness nor edema.  No joint effusions. Neurologic:  Normal speech and language. No gross focal neurologic deficits are appreciated. No gait instability. Skin:  Skin is warm, dry and intact. No rash noted. Psychiatric: Mood and affect are normal. Speech and behavior are normal.  ____________________________________________   LABS (all labs ordered are listed, but only abnormal results are displayed)  Labs Reviewed - No data to display  PROCEDURES  Procedure(s) performed: None  Critical Care performed: No  ____________________________________________   INITIAL IMPRESSION /  ASSESSMENT AND PLAN / ED COURSE  Pertinent labs & imaging results that were available during my care of the patient were reviewed by me and considered in my medical decision making (see chart for details).  Patient was encouraged to see the dentist from the list given to him today. He is started on amoxicillin 500 mg 3 times a day along with ibuprofen 800 mg 3 times a day. He was also given lidocaine solution 2% to apply to teeth on Cotten ball as needed for pain. ____________________________________________   FINAL CLINICAL IMPRESSION(S) / ED DIAGNOSES  Final diagnoses:  Pain due to dental caries      Tommi Rumps, PA-C 05/04/15 1437  [name], MD 05/04/15 [phone]

## 2015-09-01 ENCOUNTER — Encounter: Payer: Self-pay | Admitting: Emergency Medicine

## 2015-09-01 ENCOUNTER — Emergency Department
Admission: EM | Admit: 2015-09-01 | Discharge: 2015-09-01 | Disposition: A | Discharge status: Home / Self Care | Payer: BLUE CROSS/BLUE SHIELD | Attending: Emergency Medicine | Admitting: Emergency Medicine

## 2015-09-01 DIAGNOSIS — Z791 Long term (current) use of non-steroidal anti-inflammatories (NSAID): Secondary | ICD-10-CM | POA: Insufficient documentation

## 2015-09-01 DIAGNOSIS — F17210 Nicotine dependence, cigarettes, uncomplicated: Secondary | ICD-10-CM | POA: Diagnosis not present

## 2015-09-01 DIAGNOSIS — L0201 Cutaneous abscess of face: Secondary | ICD-10-CM

## 2015-09-01 DIAGNOSIS — Z792 Long term (current) use of antibiotics: Secondary | ICD-10-CM | POA: Diagnosis not present

## 2015-09-01 MED ORDER — SULFAMETHOXAZOLE-TRIMETHOPRIM 800-160 MG PO TABS: 1.0000 | ORAL_TABLET | Freq: Two times a day (BID) | ORAL | Status: DC

## 2015-09-01 NOTE — ED Provider Notes (Signed)
[facility] Emergency Department Provider Note  ____________________________________________  Time seen: On arrival  I have reviewed the triage vital signs and the nursing notes.   HISTORY  Chief Complaint Abscess    HPI Trevor George is a 30 y.o. male who presents with concern for possible abscess on his face. Patient reports he has always had a small cystic structure under the skin on his right cheek. Recently it has seemed to enlarge somewhat. It is mildly tender. No drainage.    History reviewed. No pertinent past medical history.  There are no active problems to display for this patient.   Past Surgical History  Procedure Laterality Date  . Left knee replacement    . Femur fracture surgery Left     Current Outpatient Rx  Name  Route  Sig  Dispense  Refill  . amoxicillin (AMOXIL) 500 MG capsule   Oral   Take 1 capsule (500 mg total) by mouth 3 (three) times daily.   30 capsule   0   . ibuprofen (ADVIL,MOTRIN) 800 MG tablet   Oral   Take 1 tablet (800 mg total) by mouth 3 (three) times daily.   30 tablet   0   . lidocaine (XYLOCAINE) 2 % solution   Mouth/Throat   Use as directed 20 mLs in the mouth or throat every 4 (four) hours as needed for mouth pain (apply to teeth on cotten ball prn pain).   100 mL   0   . sulfamethoxazole-trimethoprim (BACTRIM DS,SEPTRA DS) 800-160 MG tablet   Oral   Take 1 tablet by mouth 2 (two) times daily.   14 tablet   0     Allergies Morphine and related  History reviewed. No pertinent family history.  Social History Social History  Substance Use Topics  . Smoking status: Current Every Day Smoker -- 0.50 packs/day    Types: Cigarettes  . Smokeless tobacco: None  . Alcohol Use: 3.6 oz/week    6 Cans of beer per week    Review of Systems  Constitutional: Negative for fever. Eyes: Negative for visual changes. ENT: Negative for sore throat,noThroat swelling    Skin: As  above Neurological: Negative for headaches or focal weakness   ____________________________________________   PHYSICAL EXAM:  VITAL SIGNS: ED Triage Vitals  Enc Vitals Group     BP 09/01/15 0630 112/71 mmHg     Pulse Rate 09/01/15 0630 83     Resp 09/01/15 0630 16     Temp 09/01/15 0630 97.6 F (36.4 C)     Temp Source 09/01/15 0630 Oral     SpO2 09/01/15 0630 98 %     Weight 09/01/15 0630 159 lb (72.122 kg)     Height 09/01/15 0630 5\' 9"  (1.753 m)     Head Cir --      Peak Flow --      Pain Score 09/01/15 0631 10     Pain Loc --      Pain Edu? --      Excl. in GC? --      Constitutional: Alert and oriented. Well appearing and in no distress. Eyes: Conjunctivae are normal.  ENT   Head: Normocephalic and atraumatic.   Mouth/Throat: Mucous membranes are moist. Cardiovascular: Normal rate, regular rhythm.  Respiratory: Normal respiratory effort without tachypnea nor retractions.  Gastrointestinal: Soft and non-tender in all quadrants. No distention. There is no CVA tenderness. Musculoskeletal: Nontender with normal range of motion in all extremities. Neurologic:  Normal speech and language. No gross focal neurologic deficits are appreciated. Skin:  Skin is warm, dry and intact. 1 x 1 cm area of erythema to the right cheek, inferior to the zygoma. Relatively firm, no fluctuance. No drainage, has not come to head. No surrounding cellulitis Psychiatric: Mood and affect are normal. Patient exhibits appropriate insight and judgment.  ____________________________________________    LABS (pertinent positives/negatives)  Labs Reviewed - No data to display  ____________________________________________     ____________________________________________    RADIOLOGY I have personally reviewed any xrays that were ordered on this patient: None  ____________________________________________   PROCEDURES  Procedure(s) performed:  none   ____________________________________________   INITIAL IMPRESSION / ASSESSMENT AND PLAN / ED COURSE  Pertinent labs & imaging results that were available during my care of the patient were reviewed by me and considered in my medical decision making (see chart for details).  Patient with apparent long-standing subcutaneous cyst to the right face possibly new infection. No significant fluctuance. Discussed treatment options with him. He would prefer to try antibiotics initially which I think is reasonable. He will return if worsening symptoms. Hot compresses 4 times a day  ____________________________________________   FINAL CLINICAL IMPRESSION(S) / ED DIAGNOSES  Final diagnoses:  Abscess of face     [name], MD 09/01/15 [phone]

## 2015-09-01 NOTE — ED Notes (Signed)
Pt. States abscess started last Monday.  Pt. Denies having abscess in the past.

## 2015-09-01 NOTE — Discharge Instructions (Signed)

## 2015-10-19 ENCOUNTER — Emergency Department
Admission: EM | Admit: 2015-10-19 | Discharge: 2015-10-19 | Disposition: A | Discharge status: Home / Self Care | Payer: BLUE CROSS/BLUE SHIELD | Attending: Emergency Medicine | Admitting: Emergency Medicine

## 2015-10-19 ENCOUNTER — Encounter: Payer: Self-pay | Admitting: Emergency Medicine

## 2015-10-19 DIAGNOSIS — K0889 Other specified disorders of teeth and supporting structures: Secondary | ICD-10-CM | POA: Diagnosis present

## 2015-10-19 DIAGNOSIS — K047 Periapical abscess without sinus: Secondary | ICD-10-CM | POA: Diagnosis not present

## 2015-10-19 DIAGNOSIS — F17210 Nicotine dependence, cigarettes, uncomplicated: Secondary | ICD-10-CM | POA: Insufficient documentation

## 2015-10-19 MED ORDER — IBUPROFEN 800 MG PO TABS: 800.0000 mg | ORAL_TABLET | Freq: Three times a day (TID) | ORAL | Status: DC

## 2015-10-19 MED ORDER — LIDOCAINE VISCOUS 2 % MT SOLN: OROMUCOSAL | Status: DC

## 2015-10-19 MED ORDER — CLINDAMYCIN HCL 150 MG PO CAPS: 300.0000 mg | ORAL_CAPSULE | Freq: Four times a day (QID) | ORAL | Status: DC

## 2015-10-19 NOTE — Discharge Instructions (Signed)
Dental Abscess A dental abscess is pus in or around a tooth. HOME CARE  Take medicines only as told by your dentist.  If you were prescribed antibiotic medicine, finish all of it even if you start to feel better.  Rinse your mouth (gargle) often with salt water.  Do not drive or use heavy machinery, like a lawn mower, while taking pain medicine.  Do not apply heat to the outside of your mouth.  Keep all follow-up visits as told by your dentist. This is important. GET HELP IF:  Your pain is worse, and medicine does not help. GET HELP RIGHT AWAY IF:  You have a fever or chills.  Your symptoms suddenly get worse.  You have a very bad headache.  You have problems breathing or swallowing.  You have trouble opening your mouth.  You have puffiness (swelling) in your neck or around your eye.   This information is not intended to replace advice given to you by your health care provider. Make sure you discuss any questions you have with your health care provider.   Document Released: 09/24/2014 Document Reviewed: 09/24/2014 Elsevier Interactive Patient Education [employer]    You will need to see a dentist. Call the dentist of your choice or call the dental clinics listed below to see if you can schedule an appointment. Take all medication as directed  OPTIONS FOR DENTAL FOLLOW UP CARE  [place] [facility] of Health and [facility] Services - Local Safety Net Dental Clinics [email or web address]   [facility] [phone])  [facility] [phone])  [place] [phone] ext [phone])  [facility] [phone])  [facility] [phone]) This clinic caters to the indigent population and is on a lottery system. Location: [employer] [facility], [employer], [address] [place] Clinic Hours: Wednesdays from 6pm - 9pm, patients seen by a lottery  system. For dates, call or go to [email or web address] Services: Cleanings, fillings and simple extractions. Payment Options: DENTAL WORK IS FREE OF CHARGE. Bring proof of income or support. Best way to get seen: Arrive at 5:15 pm - this is a lottery, NOT first come/first serve, so arriving earlier will not increase your chances of being seen.     [facility] [phone] Select option 1 for emergencies   Location: [facility], [place], [address], [place] Clinic Hours: No walk-ins accepted - call the day before to schedule an appointment. Check in times are 9:30 am and 1:30 pm. Services: Simple extractions, temporary fillings, pulpectomy/pulp debridement, uncomplicated abscess drainage. Payment Options: PAYMENT IS DUE AT THE TIME OF SERVICE.  Fee is usually $100-200, additional surgical procedures (e.g. abscess drainage) may be extra. Cash, checks, Visa/MasterCard accepted.  Can file Medicaid if patient is covered for dental - patient should call case worker to check. No discount for [facility] patients. Best way to get seen: MUST call the day before and get onto the schedule. Can usually be seen the next 1-2 days. No walk-ins accepted.     [facility] [phone]   Location: [facility], [address] [place] Clinic Hours: M, W, Th, F 8am or 1:30pm, Tues 9a or 1:30 - first come/first served. Services: Simple extractions, temporary fillings, uncomplicated abscess drainage.  You do not need to be an [facility] resident. Payment Options: PAYMENT IS DUE AT THE TIME OF SERVICE. Dental insurance, otherwise sliding scale - bring proof of income or  support. Depending on income and treatment needed, cost is usually $50-200. Best way to get seen: Arrive early as it is first come/first served.     [facility] [phone]    Location: [address] Clinic Hours: Mon-Thu 8a-5p Services: Most basic dental services including extractions and fillings. Payment Options: PAYMENT IS DUE AT THE TIME OF SERVICE. Sliding scale, up to 50% off - bring proof if income or support. Medicaid with dental option accepted. Best way to get seen: Call to schedule an appointment, can usually be seen within 2 weeks OR they will try to see walk-ins - show up at 8a or 2p (you may have to wait).     [facility] [phone] [place] RESIDENTS ONLY   Location: [facility], [address], [place], [place] [id number] Clinic Hours: By appointment only. Monday - Thursday 8am-5pm, Friday 8am-12pm Services: Cleanings, fillings, extractions. Payment Options: PAYMENT IS DUE AT THE TIME OF SERVICE. Cash, Visa or MasterCard. Sliding scale - $30 minimum per service. Best way to get seen: Come in to office, complete packet and make an appointment - need proof of income or support monies for each household member and proof of [place] residence. Usually takes about a month to get in.     [facility] [phone]   Location: [address] [facility] Hours: Walk-in Urgent Care Dental Services are offered Monday-Friday mornings only. The numbers of emergencies accepted daily is limited to the number of providers available. Maximum 15 - Mondays, Wednesdays & Thursdays Maximum 10 - Tuesdays & Fridays Services: You do not need to be a [place] resident to be seen for a dental emergency. Emergencies are defined as pain, swelling, abnormal bleeding, or dental trauma. Walkins will receive x-rays if needed. NOTE: Dental cleaning is not an emergency. Payment Options: PAYMENT IS DUE AT THE TIME OF SERVICE. Minimum co-pay is $40.00 for uninsured patients. Minimum co-pay is $3.00 for Medicaid with dental coverage. Dental Insurance is  accepted and must be presented at time of visit. Medicare does not cover dental. Forms of payment: Cash, credit card, checks. Best way to get seen: If not previously registered with the clinic, walk-in dental registration begins at 7:15 am and is on a first come/first serve basis. If previously registered with the clinic, call to make an appointment.     [facility] [phone] [place] RESIDENTS ONLY   Location: [address], [place], [place] Clinic Hours: Mon-Thu 10a-2p Services: Extractions only! Payment Options: FREE (donations accepted) - bring proof of income or support Best way to get seen: Call and schedule an appointment OR come at 8am on the 1st Monday of every month (except for holidays) when it is first come/first served.     [facility] [phone]   Location: [address], [place] Clinic Hours: Friday mornings Services, Payment Options, Best way to get seen: Call for info

## 2015-10-19 NOTE — ED Notes (Signed)
Patient given specific discharge instructions re: follow up for dental abscess.  List of clinics with payment info and hours given to patient.

## 2015-10-19 NOTE — ED Notes (Signed)
Pt states he woke up in the middle of the night with teeth/mouth pain on the left side. Pt states this is the worst pain he has had in his mouth and it is usually on the right side.  Pt states he took Aleve this morning for pain which did not help.  Pt also used warm compress on face which helped some. Pt has not been to the dentist in a while and states that dentists are expensive.

## 2015-10-19 NOTE — ED Provider Notes (Signed)
[facility] Emergency Department Provider Note   ____________________________________________  Time seen: Approximately 8:30 AM  I have reviewed the triage vital signs and the nursing notes.   HISTORY  Chief Complaint Dental Pain   HPI Trevor George is a 30 y.o. male is here with complaints of left lower dental pain.Patient states he took Aleve this morning which did not help. Patient has been using warm compresses to his face which has given him some relief. He states that even though he's been seen here quite often in the emergency room for dental pain and he has not seen a dentist yet. Because "they are expensive". Patient continues to smoke one pack cigarettes per day. He rates his pain a 10 over 10.   History reviewed. No pertinent past medical history.  There are no active problems to display for this patient.   Past Surgical History  Procedure Laterality Date  . Left knee replacement    . Femur fracture surgery Left     Current Outpatient Rx  Name  Route  Sig  Dispense  Refill  . clindamycin (CLEOCIN) 150 MG capsule   Oral   Take 2 capsules (300 mg total) by mouth 4 (four) times daily.   56 capsule   0   . ibuprofen (ADVIL,MOTRIN) 800 MG tablet   Oral   Take 1 tablet (800 mg total) by mouth 3 (three) times daily.   30 tablet   0   . lidocaine (XYLOCAINE) 2 % solution      Apply to cotton ball and apply to gum prn pain   60 mL   0     Allergies Morphine and related  History reviewed. No pertinent family history.  Social History Social History  Substance Use Topics  . Smoking status: Current Every Day Smoker -- 1.00 packs/day    Types: Cigarettes  . Smokeless tobacco: None  . Alcohol Use: 3.6 oz/week    6 Cans of beer per week    Review of Systems Constitutional: No fever/chills ENT: No sore throat.Has a dental pain. Cardiovascular: Denies chest pain. Respiratory: Denies shortness of  breath. Gastrointestinal:  No nausea, no vomiting.   Skin: Negative for rash. Neurological: Negative for headaches, focal weakness or numbness.  10-point ROS otherwise negative.  ____________________________________________   PHYSICAL EXAM:  VITAL SIGNS: ED Triage Vitals  Enc Vitals Group     BP 10/19/15 0825 128/68 mmHg     Pulse Rate 10/19/15 0825 66     Resp 10/19/15 0825 18     Temp 10/19/15 0825 98.2 F (36.8 C)     Temp Source 10/19/15 0825 Oral     SpO2 10/19/15 0825 100 %     Weight 10/19/15 0825 153 lb (69.4 kg)     Height 10/19/15 0825 5\' 9"  (1.753 m)     Head Cir --      Peak Flow --      Pain Score 10/19/15 0826 10     Pain Loc --      Pain Edu? --      Excl. in GC? --     Constitutional: Alert and oriented. Well appearing and in no acute distress. Eyes: Conjunctivae are normal. PERRL. EOMI. Head: Atraumatic. Nose: No congestion/rhinnorhea. Mouth/Throat: Mucous membranes are moist.  Oropharynx non-erythematous.  Moderate gum edema left lower molar area with all teeth below the gumline. No active drainage at this time. Neck: No stridor.  Supple. Hematological/Lymphatic/Immunilogical: No cervical lymphadenopathy. Cardiovascular: Normal  rate, regular rhythm. Grossly normal heart sounds.  Good peripheral circulation. Respiratory: Normal respiratory effort.  No retractions. Lungs CTAB. Musculoskeletal: Moves upper and lower extremities without any difficulty. Normal gait was noted. Neurologic:  Normal speech and language. No gross focal neurologic deficits are appreciated. No gait instability. Skin:  Skin is warm, dry and intact. No rash noted. Psychiatric: Mood and affect are normal. Speech and behavior are normal.  ____________________________________________   LABS (all labs ordered are listed, but only abnormal results are displayed)  Labs Reviewed - No data to display   PROCEDURES  Procedure(s) performed: None  Critical Care performed:  No  ____________________________________________   INITIAL IMPRESSION / ASSESSMENT AND PLAN / ED COURSE  Pertinent labs & imaging results that were available during my care of the patient were reviewed by me and considered in my medical decision making (see chart for details).  Patient was just here last month and was placed on amoxicillin. He is given a prescription for clindamycin 300 mg 4 times a day for 7 days along with Motrin 800 mg and lidocaine 2% viscous solution to apply to his gums. He was once again given a list of dental clinics in the area and also encouraged to see a dentist as soon as possible. ____________________________________________   FINAL CLINICAL IMPRESSION(S) / ED DIAGNOSES  Final diagnoses:  Dental abscess      NEW MEDICATIONS STARTED DURING THIS VISIT:  Discharge Medication List as of 10/19/2015  8:53 AM    START taking these medications   Details  clindamycin (CLEOCIN) 150 MG capsule Take 2 capsules (300 mg total) by mouth 4 (four) times daily., Starting 10/19/2015, Until Discontinued, Print         Note:  This document was prepared using Dragon voice recognition software and may include unintentional dictation errors.    Tommi Rumps, PA-C 10/19/15 1000  Arnaldo Natal, MD 10/19/15 [phone]

## 2016-05-21 ENCOUNTER — Encounter: Payer: Self-pay | Admitting: Emergency Medicine

## 2016-05-21 ENCOUNTER — Emergency Department
Admission: EM | Admit: 2016-05-21 | Discharge: 2016-05-21 | Disposition: A | Discharge status: Home / Self Care | Payer: BLUE CROSS/BLUE SHIELD | Attending: Emergency Medicine | Admitting: Emergency Medicine

## 2016-05-21 ENCOUNTER — Emergency Department: Payer: BLUE CROSS/BLUE SHIELD

## 2016-05-21 DIAGNOSIS — L723 Sebaceous cyst: Secondary | ICD-10-CM | POA: Diagnosis not present

## 2016-05-21 DIAGNOSIS — R220 Localized swelling, mass and lump, head: Secondary | ICD-10-CM | POA: Diagnosis present

## 2016-05-21 DIAGNOSIS — F17210 Nicotine dependence, cigarettes, uncomplicated: Secondary | ICD-10-CM | POA: Diagnosis not present

## 2016-05-21 LAB — BASIC METABOLIC PANEL
Anion gap: 8 (ref 5–15)
BUN: 12 mg/dL (ref 6–20)
CO2: 24 mmol/L (ref 22–32)
Calcium: 9.4 mg/dL (ref 8.9–10.3)
Chloride: 104 mmol/L (ref 101–111)
Creatinine, Ser: 0.8 mg/dL (ref 0.61–1.24)
GFR calc Af Amer: >60 mL/min (ref >60)
GFR calc non Af Amer: >60 mL/min (ref >60)
Glucose, Bld: 105 mg/dL — ABNORMAL HIGH (ref 65–99)
Potassium: 4 mmol/L (ref 3.5–5.1)
Sodium: 136 mmol/L (ref 135–145)

## 2016-05-21 MED ORDER — METHYLPREDNISOLONE SODIUM SUCC 125 MG IJ SOLR
125.0000 mg | Freq: Once | INTRAMUSCULAR | Status: AC
  Administered 2016-05-21: 125 mg via INTRAVENOUS
  Filled 2016-05-21: qty 2

## 2016-05-21 MED ORDER — CLINDAMYCIN HCL 300 MG PO CAPS: 300.0000 mg | ORAL_CAPSULE | Freq: Four times a day (QID) | ORAL | 0 refills | Status: AC

## 2016-05-21 MED ORDER — IOPAMIDOL (ISOVUE-300) INJECTION 61%
75.0000 mL | Freq: Once | INTRAVENOUS | Status: AC | PRN
  Administered 2016-05-21: 75 mL via INTRAVENOUS
  Filled 2016-05-21: qty 75

## 2016-05-21 MED ORDER — CLINDAMYCIN PHOSPHATE 600 MG/50ML IV SOLN
600.0000 mg | Freq: Once | INTRAVENOUS | Status: AC
  Administered 2016-05-21: 600 mg via INTRAVENOUS
  Filled 2016-05-21: qty 50

## 2016-05-21 NOTE — ED Provider Notes (Signed)
[facility] Emergency Department Provider Note  ____________________________________________  Time seen: Approximately 5:37 PM  I have reviewed the triage vital signs and the nursing notes.   HISTORY  Chief Complaint Facial Swelling    HPI Trevor PilaChristopher L George is a 30 y.o. male presenting to the emergency department with forehead edema and erythema with nasolabial facial swelling. Patient states that forehead became edematous 2 days ago. Patient states that he normally has "a bump" on forehead. When facial edema and erythema became noticeable, patient tried self expression. Patient was able to express a small amount of purulent exudate. He denies fever or chills. His immunizations are up-to-date. He works in [employer]. He states that he often has to wear a hard hat, which can be irritating to the area. Patient denies headache, changes in vision, shortness of breath, nausea and vomiting.    History reviewed. No pertinent past medical history.  There are no active problems to display for this patient.   Past Surgical History:  Procedure Laterality Date  . FEMUR FRACTURE SURGERY Left   . Left knee replacement      Prior to Admission medications   Medication Sig Start Date End Date Taking? Authorizing Provider  clindamycin (CLEOCIN) 300 MG capsule Take 1 capsule (300 mg total) by mouth 4 (four) times daily. 05/21/16 05/31/16  Orvil FeilJaclyn M Shray Hunley, PA-C  ibuprofen (ADVIL,MOTRIN) 800 MG tablet Take 1 tablet (800 mg total) by mouth 3 (three) times daily. 10/19/15   Tommi Rumpshonda L Summers, PA-C  lidocaine (XYLOCAINE) 2 % solution Apply to cotton ball and apply to gum prn pain 10/19/15   Tommi Rumpshonda L Summers, PA-C    Allergies Morphine and related  No family history on file.  Social History Social History  Substance Use Topics  . Smoking status: Current Every Day Smoker    Packs/day: 1.00    Types: Cigarettes  . Smokeless tobacco: Never Used  . Alcohol use 3.6 oz/week     6 Cans of beer per week     Review of Systems  Constitutional: No fever/chills ENT: He has forehead edema and erythema and nasal labial swelling. Cardiovascular: no chest pain. Respiratory: no cough. No SOB. Gastrointestinal: No abdominal pain. No nausea, no vomiting.  No diarrhea.  No constipation. Musculoskeletal: Negative for musculoskeletal pain. Neurological: Negative for headaches, focal weakness or numbness. 10-point ROS otherwise negative.  ____________________________________________   PHYSICAL EXAM:  VITAL SIGNS: ED Triage Vitals [05/21/16 1418]  Enc Vitals Group     BP (!) 127/57     Pulse Rate 63     Resp 16     Temp 97.7 F (36.5 C)     Temp Source Oral     SpO2 97 %     Weight 143 lb (64.9 kg)     Height 5\' 9"  (1.753 m)     Head Circumference      Peak Flow      Pain Score 10     Pain Loc      Pain Edu?      Excl. in GC?      Constitutional: Alert and oriented. Well appearing and in no acute distress. Eyes: Conjunctivae are normal. PERRL. EOMI. Head: Atraumatic. Patient has a 1 cm x 1 cm region of focal edema with overlying erythema at midline of forehead. A region of scabbing is visualized secondary to self expression attempts. No streaking. Additionally, patient has facial edema at nasolabial folds bilaterally. ENT:      Ears: Tympanic membranes  are pearly bilaterally without evidence of infection.      Nose: Nasal septum midline.       Mouth/Throat: Posterior pharynx is without erythema or tonsillar hypertrophy.  Neck: FROM. No pain elicited with neck flexion. Hematological/Lymphatic/Immunilogical: No cervical lymphadenopathy. Cardiovascular: Normal rate, regular rhythm. Normal S1 and S2.  Good peripheral circulation. Respiratory: Normal respiratory effort without tachypnea or retractions. Lungs CTAB. Good air entry to the bases with no decreased or absent breath sounds. Psychiatric: Mood and affect are normal. Speech and behavior are normal.  Patient exhibits appropriate insight and judgement.   ____________________________________________   LABS (all labs ordered are listed, but only abnormal results are displayed)  Labs Reviewed  BASIC METABOLIC PANEL - Abnormal; Notable for the following:       Result Value   Glucose, Bld 105 (*)    All other components within normal limits   ____________________________________________  EKG   ____________________________________________  RADIOLOGY Geraldo PitterI, Choice Kleinsasser M Khyren Hing, personally viewed and evaluated these images as part of my medical decision making, as well as reviewing the written report by the radiologist.  Ct Maxillofacial W Contrast  Result Date: 05/21/2016 CLINICAL DATA:  Swelling noted to forehead Possible abscess area note with swelling across forehead and into eyes EXAM: CT MAXILLOFACIAL WITH CONTRAST TECHNIQUE: Multidetector CT imaging of the maxillofacial structures was performed. Multiplanar CT image reconstructions were also generated. A small metallic BB was placed on the right temple in order to reliably differentiate right from left. CONTRAST:  75mL ISOVUE-300 IOPAMIDOL (ISOVUE-300) INJECTION 61% COMPARISON:  None. FINDINGS: Osseous: No fracture. No bone lesion. Periapical lucency is noted adjacent to multiple mandibular and maxillary teeth. There also multiple missing teeth and multiple carie is. No adjacent abscess or soft tissue inflammation. Orbits: Negative. No traumatic or inflammatory finding. Sinuses: Mild ethmoid sinus mucosal thickening. Minor right maxillary sinus mucosal thickening. Otherwise clear. Clear mastoid air cells. Soft tissues: There is frontal scalp swelling and enhancement. Within this, just to the left of midline, is a 7 mm round hypoattenuating lesion that bulges the overlying skin contour, consistent with a small superficial abscess. There is no underlying bone resorption or reactive change. Soft tissues are otherwise unremarkable.  No adenopathy.  Limited intracranial: Normal. IMPRESSION: 1. Inflammatory changes to the forehead soft tissues surrounding a 7 mm round cystic lesion consistent with a small abscess or inflamed or infected sebaceous cyst or skin inclusion cyst. 2. No other acute abnormalities. 3. Dental disease with multiple areas of periapical lucency, but no evidence of an acute dental infection. Electronically Signed   By: Amie Portlandavid  Ormond M.D.   On: 05/21/2016 16:50    ____________________________________________    PROCEDURES  Procedure(s) performed:    Procedures   Medications  clindamycin (CLEOCIN) IVPB 600 mg (600 mg Intravenous New Bag/Given 05/21/16 1750)  iopamidol (ISOVUE-300) 61 % injection 75 mL (75 mLs Intravenous Contrast Given 05/21/16 1629)  methylPREDNISolone sodium succinate (SOLU-MEDROL) 125 mg/2 mL injection 125 mg (125 mg Intravenous Given 05/21/16 1750)   ____________________________________________   INITIAL IMPRESSION / ASSESSMENT AND PLAN / ED COURSE  Pertinent labs & imaging results that were available during my care of the patient were reviewed by me and considered in my medical decision making (see chart for details).  Review of the [place] CSRS was performed in accordance of the [facility] prior to dispensing any controlled drugs.  Clinical Course     Assessment and Plan  Inflammatory changes to cystic lesion: Patient presents with a 1cm x 1cm region of  focal edema with overlying erythema at midline forehead. CT maxillofacial reveals inflammatory changes to soft tissue surrounding a 7 mm round sebaceous/inclusion cyst. Patient was given IV clindamycin and solumedrol  in the emergency department. He was discharged with oral clindamycin. Vital signs are reassuring at this time. I do not feel that patient meets admission criteria at this time. Strict return precautions were given twice. Patient was monitored during 3 encounters of his emergency department visit. All patient questions were answered.  Patient was advised to follow-up with otolaryngology, Dr. Willeen Cass. Patient was advised to make an appointment tomorrow.    ____________________________________________  FINAL CLINICAL IMPRESSION(S) / ED DIAGNOSES  Final diagnoses:  Facial swelling      NEW MEDICATIONS STARTED DURING THIS VISIT:  New Prescriptions   CLINDAMYCIN (CLEOCIN) 300 MG CAPSULE    Take 1 capsule (300 mg total) by mouth 4 (four) times daily.        This chart was dictated using voice recognition software/Dragon. Despite best efforts to proofread, errors can occur which can change the meaning. Any change was purely unintentional.    Orvil Feil, PA-C 05/21/16 1805    [name], MD 05/23/16 [phone]

## 2016-05-21 NOTE — ED Triage Notes (Signed)
Swelling noted to forehead  Possible abscess area note with swelling across forehead and into eyes

## 2016-05-30 ENCOUNTER — Emergency Department
Admission: EM | Admit: 2016-05-30 | Discharge: 2016-05-30 | Disposition: A | Discharge status: Home / Self Care | Payer: BLUE CROSS/BLUE SHIELD | Attending: Emergency Medicine | Admitting: Emergency Medicine

## 2016-05-30 DIAGNOSIS — F17210 Nicotine dependence, cigarettes, uncomplicated: Secondary | ICD-10-CM | POA: Diagnosis not present

## 2016-05-30 DIAGNOSIS — R21 Rash and other nonspecific skin eruption: Secondary | ICD-10-CM | POA: Diagnosis present

## 2016-05-30 DIAGNOSIS — T7840XA Allergy, unspecified, initial encounter: Secondary | ICD-10-CM | POA: Insufficient documentation

## 2016-05-30 DIAGNOSIS — Z792 Long term (current) use of antibiotics: Secondary | ICD-10-CM | POA: Diagnosis not present

## 2016-05-30 MED ORDER — METHYLPREDNISOLONE 4 MG PO TBPK: ORAL_TABLET | ORAL | 0 refills | Status: DC

## 2016-05-30 MED ORDER — DIPHENHYDRAMINE HCL 50 MG/ML IJ SOLN
25.0000 mg | Freq: Once | INTRAMUSCULAR | Status: AC
  Administered 2016-05-30: 25 mg via INTRAVENOUS
  Filled 2016-05-30: qty 1

## 2016-05-30 MED ORDER — HYDROXYZINE HCL 50 MG PO TABS: 50.0000 mg | ORAL_TABLET | Freq: Three times a day (TID) | ORAL | 0 refills | Status: DC | PRN

## 2016-05-30 MED ORDER — METHYLPREDNISOLONE SODIUM SUCC 125 MG IJ SOLR
80.0000 mg | Freq: Once | INTRAMUSCULAR | Status: AC
  Administered 2016-05-30: 80 mg via INTRAVENOUS
  Filled 2016-05-30: qty 2

## 2016-05-30 MED ORDER — FAMOTIDINE IN NACL 20-0.9 MG/50ML-% IV SOLN
20.0000 mg | Freq: Once | INTRAVENOUS | Status: AC
  Administered 2016-05-30: 20 mg via INTRAVENOUS
  Filled 2016-05-30: qty 50

## 2016-05-30 NOTE — ED Notes (Signed)
FIRST NURSE NOTE: Pt reports he started Clindamycin last week, took excedrin yesterday and today breaking out into rash on face and head, No respiratory involvement. C/o itching.

## 2016-05-30 NOTE — ED Notes (Signed)
States he feels better   Itching has stopped and rash has decreased

## 2016-05-30 NOTE — ED Notes (Addendum)
See triage note. States he was placed on clinda about 1 week ago w/o any problems  Then took some excedrin last pm and now has fine rash with same whelps to back. Also states that he has eaten a hamburger prior .  No resp distress noted

## 2016-05-30 NOTE — ED Provider Notes (Signed)
[facility] Emergency Department Provider Note   ____________________________________________   First MD Initiated Contact with Patient 05/30/16 1325     (approximate)  I have reviewed the triage vital signs and the nursing notes.   HISTORY  Chief Complaint Allergic Reaction    HPI Trevor George is a 31 y.o. male patient here today for diffuse rash which started last night after patient ate hamburgers. Patient state he is currently taking clindamycin was started on 05/22/2011. Patient states taking this medicine the past with no problem. Patient also state there is no new foods,drinks, clothing or personal hygiene products. Patient denies any angioedema or dyspnea. Patient chief complaints just intense itching. History reviewed. No pertinent past medical history.  There are no active problems to display for this patient.   Past Surgical History:  Procedure Laterality Date  . FEMUR FRACTURE SURGERY Left   . Left knee replacement      Prior to Admission medications   Medication Sig Start Date End Date Taking? Authorizing Provider  clindamycin (CLEOCIN) 300 MG capsule Take 1 capsule (300 mg total) by mouth 4 (four) times daily. 05/21/16 05/31/16  Orvil FeilJaclyn M Woods, PA-C  hydrOXYzine (ATARAX/VISTARIL) 50 MG tablet Take 1 tablet (50 mg total) by mouth 3 (three) times daily as needed for itching. 05/30/16   Joni Reiningonald K Kanin Lia, PA-C  ibuprofen (ADVIL,MOTRIN) 800 MG tablet Take 1 tablet (800 mg total) by mouth 3 (three) times daily. 10/19/15   Tommi Rumpshonda L Summers, PA-C  lidocaine (XYLOCAINE) 2 % solution Apply to cotton ball and apply to gum prn pain 10/19/15   Tommi Rumpshonda L Summers, PA-C  methylPREDNISolone (MEDROL DOSEPAK) 4 MG TBPK tablet Take Tapered dose as directed; start medication in the morning. 05/30/16   Joni Reiningonald K Jennilee Demarco, PA-C    Allergies Morphine and related  No family history on file.  Social History Social History  Substance Use Topics  . Smoking  status: Current Every Day Smoker    Packs/day: 1.00    Types: Cigarettes  . Smokeless tobacco: Never Used  . Alcohol use 3.6 oz/week    6 Cans of beer per week    Review of Systems Constitutional: No fever/chills Eyes: No visual changes. ENT: No sore throat. Cardiovascular: Denies chest pain. Respiratory: Denies shortness of breath. Gastrointestinal: No abdominal pain.  No nausea, no vomiting.  No diarrhea.  No constipation. Genitourinary: Negative for dysuria. Musculoskeletal: Negative for back pain. Skin: Positive for rash. Neurological: Negative for headaches, focal weakness or numbness.    ____________________________________________   PHYSICAL EXAM:  VITAL SIGNS: ED Triage Vitals  Enc Vitals Group     BP 05/30/16 1319 109/61     Pulse Rate 05/30/16 1319 66     Resp 05/30/16 1319 16     Temp 05/30/16 1319 97 F (36.1 C)     Temp Source 05/30/16 1319 Oral     SpO2 05/30/16 1319 100 %     Weight 05/30/16 1315 143 lb (64.9 kg)     Height 05/30/16 1315 5\' 9"  (1.753 m)     Head Circumference --      Peak Flow --      Pain Score 05/30/16 1315 0     Pain Loc --      Pain Edu? --      Excl. in GC? --     Constitutional: Alert and oriented. Well appearing and in no acute distress. Eyes: Conjunctivae are normal. PERRL. EOMI. Head: Atraumatic. Nose: No congestion/rhinnorhea. Mouth/Throat: Mucous  membranes are moist.  Oropharynx non-erythematous. Neck: No stridor.  No cervical spine tenderness to palpation. Hematological/Lymphatic/Immunilogical: No cervical lymphadenopathy. Cardiovascular: Normal rate, regular rhythm. Grossly normal heart sounds.  Good peripheral circulation. Respiratory: Normal respiratory effort.  No retractions. Lungs CTAB. Gastrointestinal: Soft and nontender. No distention. No abdominal bruits. No CVA tenderness. Musculoskeletal: No lower extremity tenderness nor edema.  No joint effusions. Neurologic:  Normal speech and language. No gross focal  neurologic deficits are appreciated. No gait instability. Skin:  Skin is warm, dry and intact. No rash noted. Psychiatric: Mood and affect are normal. Speech and behavior are normal.  ____________________________________________   LABS (all labs ordered are listed, but only abnormal results are displayed)  Labs Reviewed - No data to display ____________________________________________  EKG   ____________________________________________  RADIOLOGY   ____________________________________________   PROCEDURES  Procedure(s) performed: None  Procedures  Critical Care performed: No  ____________________________________________   INITIAL IMPRESSION / ASSESSMENT AND PLAN / ED COURSE  Pertinent labs & imaging results that were available during my care of the patient were reviewed by me and considered in my medical decision making (see chart for details). Allergic reaction etiology undetermined. Patient given discharge care instructions. Patient given a prescription for Atarax and Medrol Dosepak. Patient given a work note. Patient was determined to ED if condition worsens.  Clinical Course    Patient presents with diffuse macular wheals since last night. Patient stated noticed after eating home hamburger he started itching. Status post IV of Solu-Medrol, Pepcid, and Benadryl patient states more than 90% resolution of complaint. ____________________________________________   FINAL CLINICAL IMPRESSION(S) / ED DIAGNOSES  Final diagnoses:  Allergic reaction, initial encounter      NEW MEDICATIONS STARTED DURING THIS VISIT:  New Prescriptions   HYDROXYZINE (ATARAX/VISTARIL) 50 MG TABLET    Take 1 tablet (50 mg total) by mouth 3 (three) times daily as needed for itching.   METHYLPREDNISOLONE (MEDROL DOSEPAK) 4 MG TBPK TABLET    Take Tapered dose as directed; start medication in the morning.     Note:  This document was prepared using Dragon voice recognition software and  may include unintentional dictation errors.    Joni Reining, PA-C 05/30/16 1432    Rockne Menghini, MD 05/30/16 [phone]

## 2016-05-30 NOTE — ED Triage Notes (Signed)
Pt was seen in er and given clindamycin on 12/29 - yesterday pt broke out in a rash that covers entire body - rash is a fine bumpy red rash with some whelps on the back of his legs - pt appears in no acute distress - respirations are even and unlabored - denies shortness of breath - talking in complete sentences without difficulty

## 2016-06-11 ENCOUNTER — Encounter: Payer: Self-pay | Admitting: Emergency Medicine

## 2016-06-11 ENCOUNTER — Emergency Department
Admission: EM | Admit: 2016-06-11 | Discharge: 2016-06-11 | Disposition: A | Discharge status: Home / Self Care | Payer: BLUE CROSS/BLUE SHIELD | Attending: Emergency Medicine | Admitting: Emergency Medicine

## 2016-06-11 DIAGNOSIS — J09X2 Influenza due to identified novel influenza A virus with other respiratory manifestations: Secondary | ICD-10-CM | POA: Diagnosis not present

## 2016-06-11 DIAGNOSIS — F17210 Nicotine dependence, cigarettes, uncomplicated: Secondary | ICD-10-CM | POA: Diagnosis not present

## 2016-06-11 DIAGNOSIS — R21 Rash and other nonspecific skin eruption: Secondary | ICD-10-CM | POA: Diagnosis present

## 2016-06-11 DIAGNOSIS — J101 Influenza due to other identified influenza virus with other respiratory manifestations: Secondary | ICD-10-CM

## 2016-06-11 LAB — INFLUENZA PANEL BY PCR (TYPE A & B)
Influenza A By PCR: POSITIVE — AB
Influenza B By PCR: NEGATIVE

## 2016-06-11 MED ORDER — ONDANSETRON 4 MG PO TBDP
ORAL_TABLET | ORAL | Status: AC
  Filled 2016-06-11: qty 1

## 2016-06-11 MED ORDER — ONDANSETRON 4 MG PO TBDP: 4.0000 mg | ORAL_TABLET | Freq: Three times a day (TID) | ORAL | 0 refills | Status: AC | PRN

## 2016-06-11 MED ORDER — OSELTAMIVIR PHOSPHATE 75 MG PO CAPS: 75.0000 mg | ORAL_CAPSULE | Freq: Two times a day (BID) | ORAL | 0 refills | Status: AC

## 2016-06-11 MED ORDER — ONDANSETRON 4 MG PO TBDP
4.0000 mg | ORAL_TABLET | Freq: Once | ORAL | Status: AC
  Administered 2016-06-11: 4 mg via ORAL

## 2016-06-11 NOTE — ED Provider Notes (Signed)
[facility] Emergency Department Provider Note  ____________________________________________   First MD Initiated Contact with Patient 06/11/16 [phone]     (approximate)  I have reviewed the triage vital signs and the nursing notes.   HISTORY  Chief Complaint Rash   HPI ERMON George is a 31 y.o. male is here with complaint of generalized rash. Patient states he was seen in the emergency room several weeks ago for the same thing at which time he thought that he had an allergic reaction to some soap. He also complains of fever, headache, nausea and generalized body aches that began last evening. Patient states he vomited once. He denies any diarrhea. Patient denies any itching or difficulty breathing. Patient also denies upper respiratory symptoms, ear pain or throat pain.   History reviewed. No pertinent past medical history.  There are no active problems to display for this patient.   Past Surgical History:  Procedure Laterality Date  . FEMUR FRACTURE SURGERY Left   . Left knee replacement      Prior to Admission medications   Medication Sig Start Date End Date Taking? Authorizing Provider  ondansetron (ZOFRAN ODT) 4 MG disintegrating tablet Take 1 tablet (4 mg total) by mouth every 8 (eight) hours as needed for nausea or vomiting. 06/11/16   Tommi Rumps, PA-C  oseltamivir (TAMIFLU) 75 MG capsule Take 1 capsule (75 mg total) by mouth 2 (two) times daily. 06/11/16 06/16/16  Tommi Rumps, PA-C    Allergies Morphine and related  No family history on file.  Social History Social History  Substance Use Topics  . Smoking status: Current Every Day Smoker    Packs/day: 1.00    Types: Cigarettes  . Smokeless tobacco: Never Used  . Alcohol use 3.6 oz/week    6 Cans of beer per week    Review of Systems Constitutional: Subjective fever/current chills Eyes: No visual changes. ENT: No sore throat. Cardiovascular: Denies chest  pain. Respiratory: Denies shortness of breath. Gastrointestinal: No abdominal pain.  Positive nausea, positive times one vomiting.  No diarrhea.   Musculoskeletal: Positive generalized muscle aches. Skin: Negative for rash. Neurological: Positive for headaches, no focal weakness or numbness.  10-point ROS otherwise negative.  ____________________________________________   PHYSICAL EXAM:  VITAL SIGNS: ED Triage Vitals  Enc Vitals Group     BP 06/11/16 0657 122/74     Pulse Rate 06/11/16 0657 (!) 104     Resp 06/11/16 0657 18     Temp 06/11/16 0657 98 F (36.7 C)     Temp Source 06/11/16 0657 Oral     SpO2 06/11/16 0657 99 %     Weight 06/11/16 0656 143 lb (64.9 kg)     Height 06/11/16 0656 5\' 9"  (1.753 m)     Head Circumference --      Peak Flow --      Pain Score --      Pain Loc --      Pain Edu? --      Excl. in GC? --     Constitutional: Alert and oriented. Well appearing and in no acute distress. Eyes: Conjunctivae are normal. PERRL. EOMI. Head: Atraumatic. Nose: No congestion/rhinnorhea. Mouth/Throat: Mucous membranes are moist.  Oropharynx non-erythematous. Neck: No stridor.   Hematological/Lymphatic/Immunilogical: No cervical lymphadenopathy. Cardiovascular: Normal rate, regular rhythm. Grossly normal heart sounds.  Good peripheral circulation. Respiratory: Normal respiratory effort.  No retractions. Lungs CTAB. Gastrointestinal: Soft and nontender. No distention. Bowel sounds normoactive 4 quadrants. Musculoskeletal:  Moves upper and lower extremities pain difficulty. Normal gait was noted. Neurologic:  Normal speech and language. No gross focal neurologic deficits are appreciated. No gait instability. Skin:  Skin is warm, dry and intact. No obvious rash was noted on today's exam. Psychiatric: Mood and affect are normal. Speech and behavior are normal.  ____________________________________________   LABS (all labs ordered are listed, but only abnormal  results are displayed)  Labs Reviewed  INFLUENZA PANEL BY PCR (TYPE A & B) - Abnormal; Notable for the following:       Result Value   Influenza A By PCR POSITIVE (*)    All other components within normal limits    PROCEDURES  Procedure(s) performed: None  Procedures  Critical Care performed: No  ____________________________________________   INITIAL IMPRESSION / ASSESSMENT AND PLAN / ED COURSE  Pertinent labs & imaging results that were available during my care of the patient were reviewed by me and considered in my medical decision making (see chart for details).  Patient was made aware that he is positive for influenza A.  Patient was given Zofran while in the emergency room and states that this is completely resolved his nausea. Patient was given a prescription for same and also started on Tamiflu. Patient was encouraged to increase fluids and also take Tylenol or ibuprofen as needed for muscle aches or fever. He will follow-up with [facility] if any continued problems.     ____________________________________________   FINAL CLINICAL IMPRESSION(S) / ED DIAGNOSES  Final diagnoses:  Influenza A      NEW MEDICATIONS STARTED DURING THIS VISIT:  Discharge Medication List as of 06/11/2016  8:26 AM    START taking these medications   Details  ondansetron (ZOFRAN ODT) 4 MG disintegrating tablet Take 1 tablet (4 mg total) by mouth every 8 (eight) hours as needed for nausea or vomiting., Starting Fri 06/11/2016, Print    oseltamivir (TAMIFLU) 75 MG capsule Take 1 capsule (75 mg total) by mouth 2 (two) times daily., Starting Fri 06/11/2016, Until Wed 06/16/2016, Print         Note:  This document was prepared using Dragon voice recognition software and may include unintentional dictation errors.    Tommi RumpsRhonda L Dashonna Chagnon, PA-C 06/11/16 1510    Charlynne Panderavid Hsienta Yao, MD 06/14/16 [phone]

## 2016-06-11 NOTE — ED Triage Notes (Signed)
Patient ambulatory to triage with steady gait, without difficulty or distress noted; pt reports awoke this am with generalized rash; st seen for same few wks ago with unknown cause

## 2016-06-11 NOTE — ED Notes (Signed)
See triage note    States he woke up with generalized rash  Has been seen for same recently  No resp distress   But also noticed headache and fever at home this am  positive vomiting times 1  Afebrile on arrival

## 2016-06-11 NOTE — Discharge Instructions (Signed)
[facility]. Increase fluids. Tylenol or ibuprofen as needed for muscle aches or fever. Tamiflu twice a day for 5 days.  Zofran if needed for nausea

## 2016-06-11 NOTE — ED Notes (Signed)
States nausea is 'better' 

## 2017-05-10 ENCOUNTER — Other Ambulatory Visit: Payer: Self-pay

## 2017-05-10 ENCOUNTER — Encounter: Payer: Self-pay | Admitting: Emergency Medicine

## 2017-05-10 ENCOUNTER — Emergency Department
Admission: EM | Admit: 2017-05-10 | Discharge: 2017-05-10 | Disposition: A | Discharge status: Home / Self Care | Payer: BLUE CROSS/BLUE SHIELD | Attending: Emergency Medicine | Admitting: Emergency Medicine

## 2017-05-10 DIAGNOSIS — Z5321 Procedure and treatment not carried out due to patient leaving prior to being seen by health care provider: Secondary | ICD-10-CM | POA: Diagnosis not present

## 2017-05-10 DIAGNOSIS — K047 Periapical abscess without sinus: Secondary | ICD-10-CM | POA: Insufficient documentation

## 2017-05-10 NOTE — ED Triage Notes (Addendum)
Patient ambulatory to triage with steady gait, without difficulty or distress noted; pt reports abscess that began to right upper gum last couple days; rx doxycline yesterday at urgent care but st he is unable to take it because it makes him sick; swelling noted to right cheek with tenderness

## 2017-10-10 IMAGING — CT CT MAXILLOFACIAL W/ CM
3 series · 15 of 47 positions shown, 18 images · IV contrast (iopamidol)
Comparison: None.

CLINICAL DATA: Swelling noted to forehead Possible abscess area
note with swelling across forehead and into eyes

EXAM:
CT MAXILLOFACIAL WITH CONTRAST
TECHNIQUE: Multidetector CT imaging of the maxillofacial structures was
performed. Multiplanar CT image reconstructions were also generated.
A small metallic BB was placed on the right temple in order to
reliably differentiate right from left.
CONTRAST:  75mL 8VLGWN-100 IOPAMIDOL (8VLGWN-100) INJECTION 61%

[Series 2: max soft · axial · 0.35mm/px · z∈[+1420,+1568]mm · 9 of 86 slices shown, 12 images]
[im 6/86  brain]
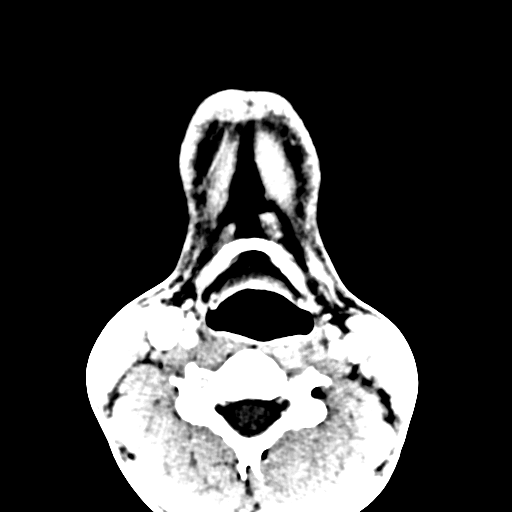
[im 6/86  bone]
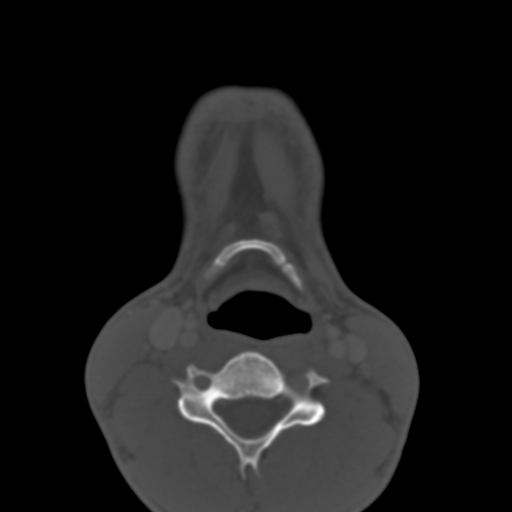
[im 15/86  bone]
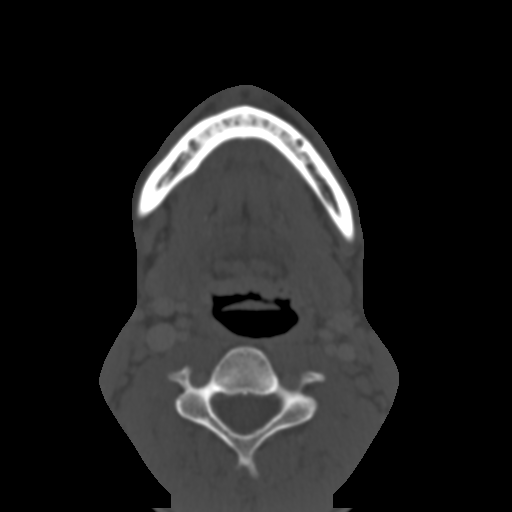
[im 24/86  bone]
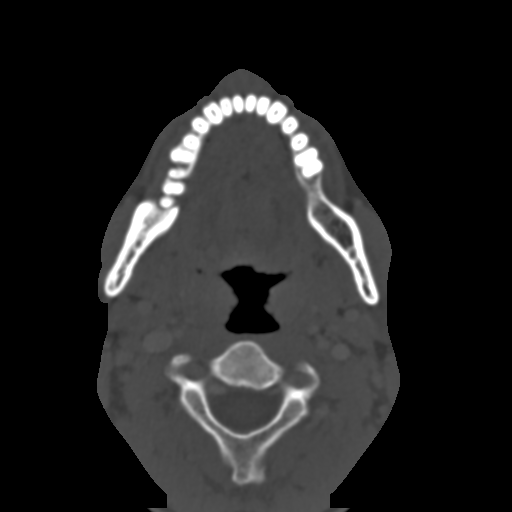
[im 33/86  bone]
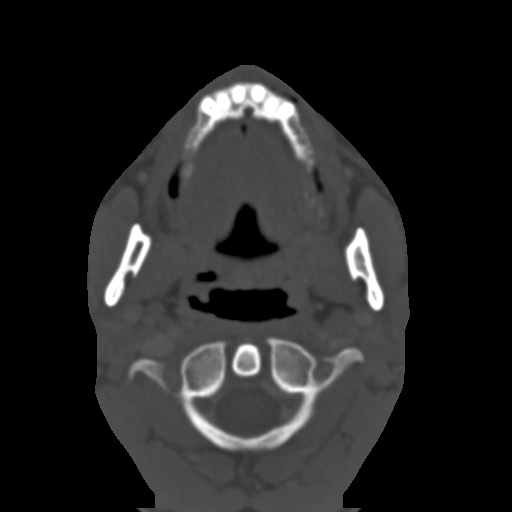
[im 44/86  brain]
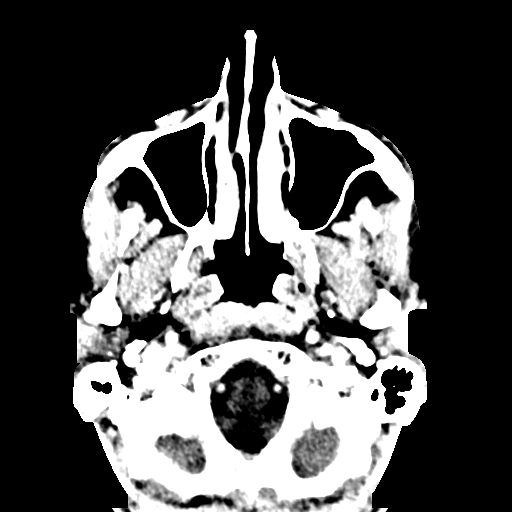
[im 44/86  bone]
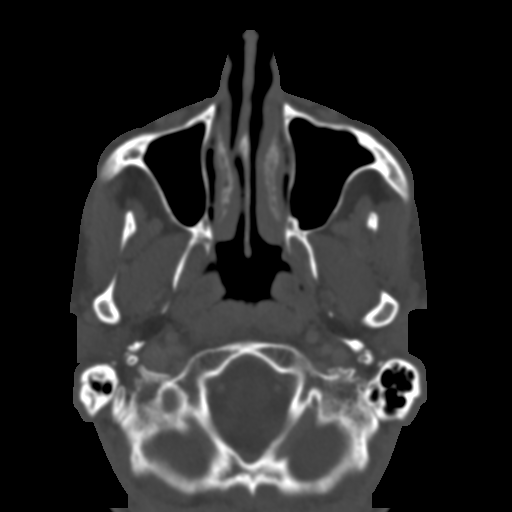
[im 53/86  bone]
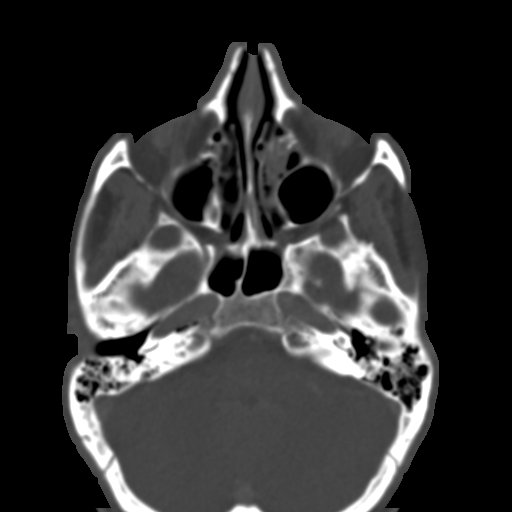
[im 62/86  bone]
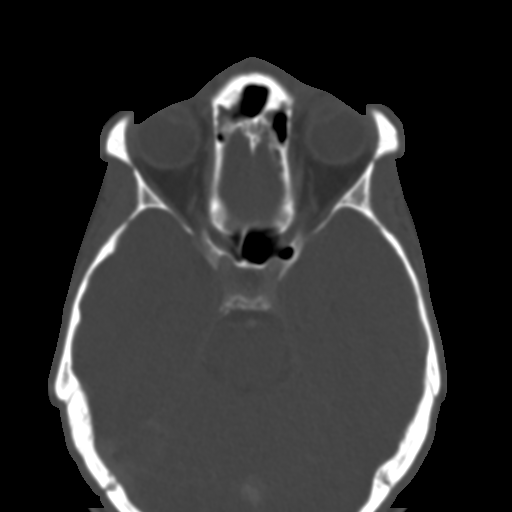
[im 71/86  bone]
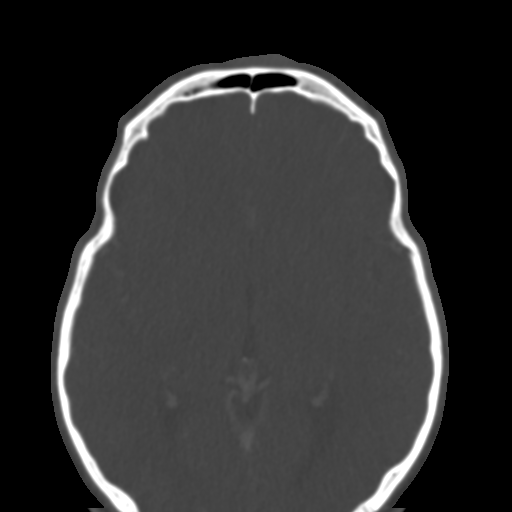
[im 80/86  brain]
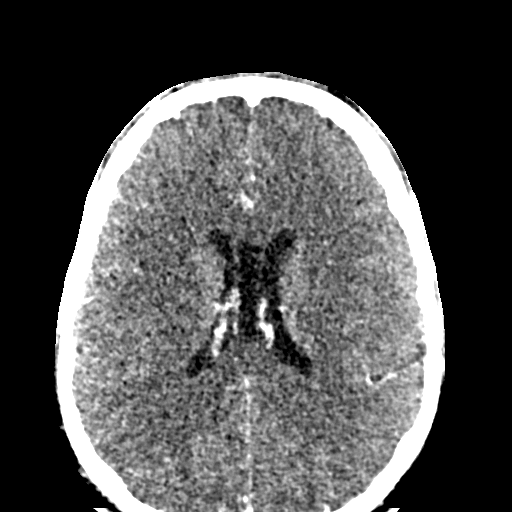
[im 80/86  bone]
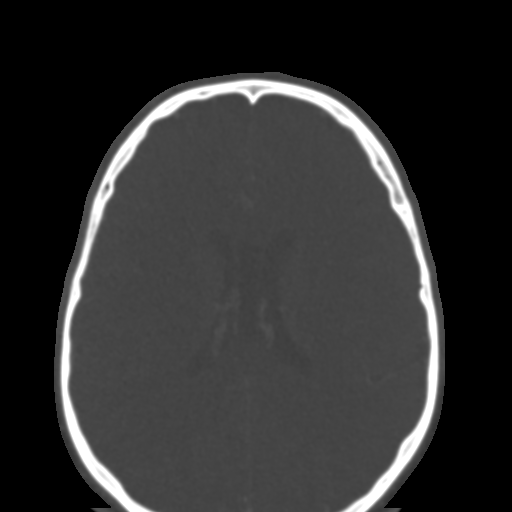

[Series 4: coronal soft · coronal · 0.34mm/px · 3 of 72 slices shown]
[im 24/72  bone]
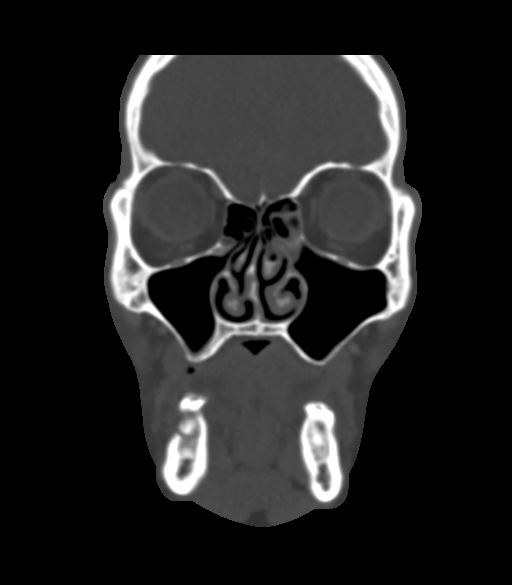
[im 32/72  bone]
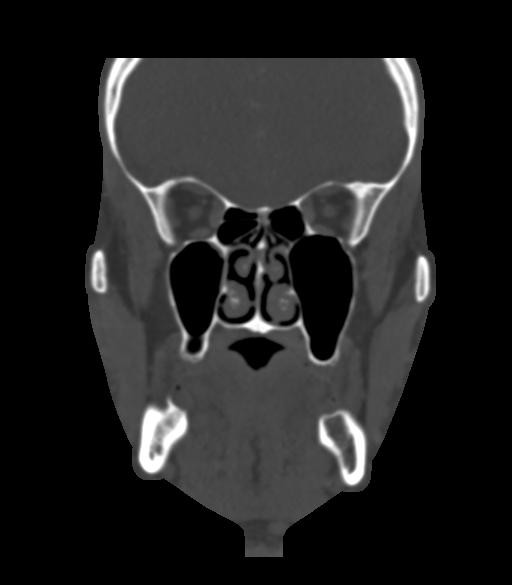
[im 40/72  bone]
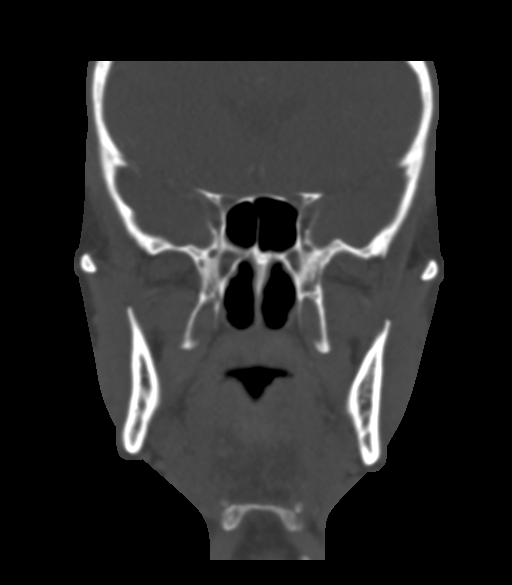

[Series 5: sagittal soft · sagittal · 0.30mm/px · 3 of 73 slices shown]
[im 25/73  bone]
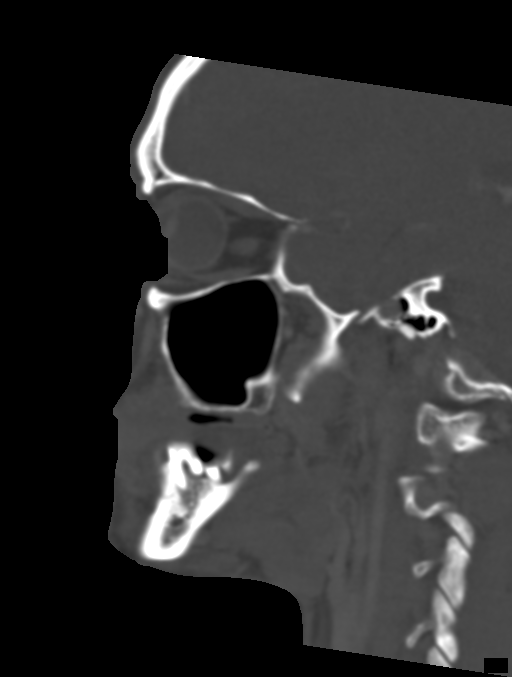
[im 37/73  bone]
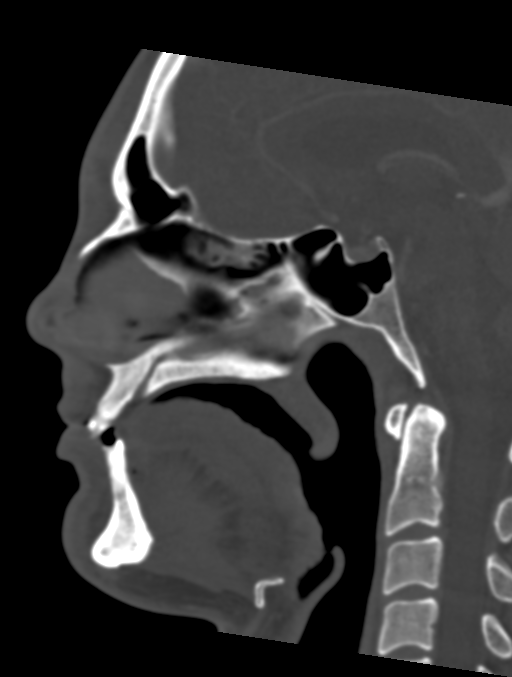
[im 49/73  bone]
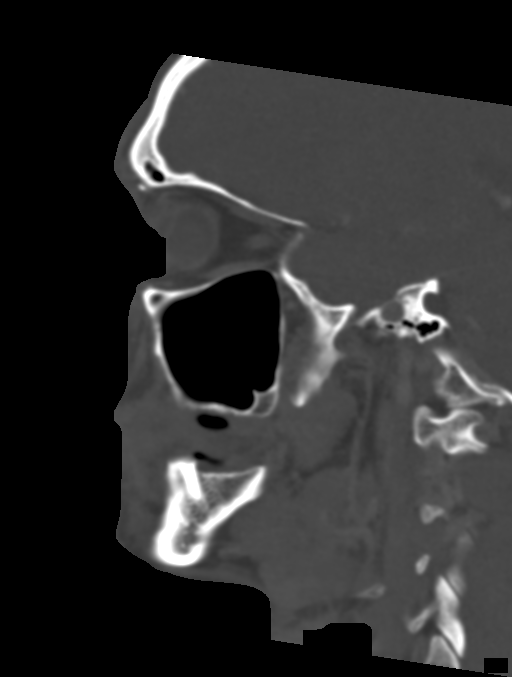

[15 of 47 positions shown; findings below may reference images not displayed]

FINDINGS: Osseous: No fracture. No bone lesion. Periapical lucency is noted
adjacent to multiple mandibular and maxillary teeth. There also
multiple missing teeth and multiple Mohabir is. No adjacent abscess or
soft tissue inflammation.

Orbits: Negative. No traumatic or inflammatory finding.

Sinuses: Mild ethmoid sinus mucosal thickening. Minor right
maxillary sinus mucosal thickening. Otherwise clear. Clear mastoid
air cells.

Soft tissues: There is frontal scalp swelling and enhancement.
Within this, just to the left of midline, is a 7 mm round
hypoattenuating lesion that bulges the overlying skin contour,
consistent with a small superficial abscess. There is no underlying
bone resorption or reactive change.

Soft tissues are otherwise unremarkable.  No adenopathy.

Limited intracranial: Normal.
IMPRESSION: 1. Inflammatory changes to the forehead soft tissues surrounding a 7
mm round cystic lesion consistent with a small abscess or inflamed
or infected sebaceous cyst or skin inclusion cyst.
2. No other acute abnormalities.
3. Dental disease with multiple areas of periapical lucency, but no
evidence of an acute dental infection.

## 2023-12-26 ENCOUNTER — Emergency Department
Admission: EM | Admit: 2023-12-26 | Discharge: 2023-12-26 | Disposition: A | Discharge status: Home / Self Care | Payer: Self-pay | Attending: Emergency Medicine | Admitting: Emergency Medicine

## 2023-12-26 ENCOUNTER — Encounter: Payer: Self-pay | Admitting: Emergency Medicine

## 2023-12-26 ENCOUNTER — Other Ambulatory Visit: Payer: Self-pay

## 2023-12-26 DIAGNOSIS — L03115 Cellulitis of right lower limb: Secondary | ICD-10-CM | POA: Insufficient documentation

## 2023-12-26 DIAGNOSIS — M79604 Pain in right leg: Secondary | ICD-10-CM | POA: Diagnosis present

## 2023-12-26 LAB — COMPREHENSIVE METABOLIC PANEL WITH GFR
ALT: 44 U/L (ref 0–44)
AST: 46 U/L — ABNORMAL HIGH (ref 15–41)
Albumin: 3.8 g/dL (ref 3.5–5.0)
Alkaline Phosphatase: 84 U/L (ref 38–126)
Anion gap: 13 (ref 5–15)
BUN: 9 mg/dL (ref 6–20)
CO2: 23 mmol/L (ref 22–32)
Calcium: 9.4 mg/dL (ref 8.9–10.3)
Chloride: 106 mmol/L (ref 98–111)
Creatinine, Ser: 0.92 mg/dL (ref 0.61–1.24)
GFR, Estimated: >60 mL/min (ref >60)
Glucose, Bld: 119 mg/dL — ABNORMAL HIGH (ref 70–99)
Potassium: 3.8 mmol/L (ref 3.5–5.1)
Sodium: 142 mmol/L (ref 135–145)
Total Bilirubin: 1 mg/dL (ref 0.0–1.2)
Total Protein: 7.1 g/dL (ref 6.5–8.1)

## 2023-12-26 LAB — CBC WITH DIFFERENTIAL/PLATELET
Abs Immature Granulocytes: 0.04 K/uL (ref 0.00–0.07)
Basophils Absolute: 0 K/uL (ref 0.0–0.1)
Basophils Relative: 0 %
Eosinophils Absolute: 0.1 K/uL (ref 0.0–0.5)
Eosinophils Relative: 1 %
HCT: 36.6 % — ABNORMAL LOW (ref 39.0–52.0)
Hemoglobin: 12.2 g/dL — ABNORMAL LOW (ref 13.0–17.0)
Immature Granulocytes: 0 %
Lymphocytes Relative: 8 %
Lymphs Abs: 0.8 K/uL (ref 0.7–4.0)
MCH: 30.5 pg (ref 26.0–34.0)
MCHC: 33.3 g/dL (ref 30.0–36.0)
MCV: 91.5 fL (ref 80.0–100.0)
Monocytes Absolute: 0.7 K/uL (ref 0.1–1.0)
Monocytes Relative: 8 %
Neutro Abs: 8 K/uL — ABNORMAL HIGH (ref 1.7–7.7)
Neutrophils Relative %: 83 %
Platelets: 190 K/uL (ref 150–400)
RBC: 4 MIL/uL — ABNORMAL LOW (ref 4.22–5.81)
RDW: 12.6 % (ref 11.5–15.5)
WBC: 9.7 K/uL (ref 4.0–10.5)
nRBC: 0 % (ref 0.0–0.2)

## 2023-12-26 LAB — LACTIC ACID, PLASMA: Lactic Acid, Venous: 1.6 mmol/L (ref 0.5–1.9)

## 2023-12-26 MED ORDER — VANCOMYCIN HCL IN DEXTROSE 1-5 GM/200ML-% IV SOLN
1000.0000 mg | Freq: Once | INTRAVENOUS | Status: AC
  Administered 2023-12-26: 1000 mg via INTRAVENOUS
  Filled 2023-12-26: qty 200

## 2023-12-26 MED ORDER — CEPHALEXIN 500 MG PO CAPS: 500.0000 mg | ORAL_CAPSULE | Freq: Four times a day (QID) | ORAL | 0 refills | Status: AC

## 2023-12-26 MED ORDER — SODIUM CHLORIDE 0.9 % IV SOLN
2.0000 g | Freq: Once | INTRAVENOUS | Status: AC
  Administered 2023-12-26: 2 g via INTRAVENOUS
  Filled 2023-12-26: qty 20

## 2023-12-26 MED ORDER — DOXYCYCLINE HYCLATE 100 MG PO TABS: 100.0000 mg | ORAL_TABLET | Freq: Two times a day (BID) | ORAL | 0 refills | Status: AC

## 2023-12-26 NOTE — ED Provider Notes (Signed)
 [facility] Provider Note    Event Date/Time   First MD Initiated Contact with Patient 12/26/23 [phone]     (approximate)   History   Leg Pain   HPI  Trevor George is a 38 year old male presenting to the emergency department for evaluation of leg pain.  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  He pulled a hair from this area.  Since then, he has noticed redness and swelling developing in this area and beginning to track of his leg.  Reports subjective fevers at home.  No numbness, tingling, focal weakness.  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     Physical Exam   Triage Vital Signs: ED Triage Vitals [12/26/23 0937]  Encounter Vitals Group     BP (!) 150/87     Girls Systolic BP Percentile      Girls Diastolic BP Percentile      Boys Systolic BP Percentile      Boys Diastolic BP Percentile      Pulse Rate (!) 104     Resp 18     Temp 98.2 F (36.8 C)     Temp Source Oral     SpO2 100 %     Weight 165 lb (74.8 kg)     Height 5' 10 (1.778 m)     Head Circumference      Peak Flow      Pain Score 8     Pain Loc      Pain Education      Exclude from Growth Chart     Most recent vital signs: Vitals:   12/26/23 0937  BP: (!) 150/87  Pulse: (!) 104  Resp: 18  Temp: 98.2 F (36.8 C)  SpO2: 100%     General: Awake, interactive  CV:  Regular rate at the time of my evaluation, good peripheral perfusion.  Resp:  Unlabored respirations Abd:  Nondistended.  Neuro:  Symmetric facial movement, fluid speech MSK:  There is a scab over the right anterior knee with surrounding area of erythema tracking upwards.  The area has been marked previously by patient, does does not appear to be extending beyond marked area, though there is an area more proximal with some erythema as well.  Warm to the touch and tender.  Is able to range knee without significant  knee effusion.  See image below.    ED Results / Procedures / Treatments   Labs (all labs ordered are listed, but only abnormal results are displayed) Labs Reviewed  COMPREHENSIVE METABOLIC PANEL WITH GFR - Abnormal; Notable for the following components:      Result Value   Glucose, Bld 119 (*)    AST 46 (*)    All other components within normal limits  CBC WITH DIFFERENTIAL/PLATELET - Abnormal; Notable for the following components:   RBC 4.00 (*)    Hemoglobin 12.2 (*)    HCT 36.6 (*)    Neutro Abs 8.0 (*)    All other components within normal limits  CULTURE, BLOOD (ROUTINE X 2)  CULTURE, BLOOD (ROUTINE X 2)  LACTIC ACID, PLASMA     EKG EKG independently reviewed and interpreted by myself demonstrates:    RADIOLOGY Imaging independently reviewed and interpreted by myself demonstrates:   Formal Radiology Read:  No results found.  PROCEDURES:  Critical Care performed: No  Procedures   MEDICATIONS ORDERED IN ED: Medications  cefTRIAXone  (ROCEPHIN ) 2 g in sodium chloride  0.9 % 100 mL IVPB (0 g Intravenous Stopped 12/26/23 1118)  vancomycin  (VANCOCIN ) IVPB 1000 mg/200 mL premix (1,000 mg Intravenous New Bag/Given 12/26/23 1120)     IMPRESSION / MDM / ASSESSMENT AND PLAN / ED COURSE  I reviewed the triage vital signs and the nursing notes.  Differential diagnosis includes, but is not limited to, cellulitis, clinical history not suggestive of DVT, no blistering, sloughing suggestive of deeper space infection  Patient's presentation is most consistent with acute presentation with potential threat to life or bodily function.  38 year old male presenting to the emergency department for evaluation of leg pain.  Mild tachycardia on presentation.  Afebrile.  Labs with reassuring white blood cell count and lactate.  Labs with mild anemia without recent prior for comparison.  CMP without significant derangement.  Patient overall well-appearing here.  Considered admission given  worsening rash with fevers at home, but with overall reassuring workup here, did discuss dose of IV antibiotics in the ER and discharged on oral antibiotics with strict return precautions.  Patient is comfortable with this plan.  Not meet SIRS criteria.  Blood culture sent.  Given a dose of Rocephin  and vancomycin  here.  Will DC with Keflex  and doxycycline  to include MRSA coverage.  Strict return precautions were provided included for worsening symptoms, no improvement in 48 hours, or any other new or concerning symptoms.  Patient was discharged in stable condition.      FINAL CLINICAL IMPRESSION(S) / ED DIAGNOSES   Final diagnoses:  Cellulitis of right lower extremity     Rx / DC Orders   ED Discharge Orders          Ordered    cephALEXin  (KEFLEX ) 500 MG capsule  4 times daily        12/26/23 1225    doxycycline  (VIBRA -TABS) 100 MG tablet  2 times daily        12/26/23 1226             Note:  This document was prepared using Dragon voice recognition software and may include unintentional dictation errors.   Levander Slate, MD 12/26/23 1226

## 2023-12-26 NOTE — ED Triage Notes (Signed)
 Patient to ED via POV for right leg pain/redness. Pt reports pulling a hair out of right knee a few days ago and now red and swollen and moving up leg. States hx of MRSA  1 set of blood cultures sent to lab if needed.

## 2023-12-26 NOTE — Discharge Instructions (Addendum)
 You are seen in the emergency department today for evaluation of a rash on your leg.  I suspect this is related to a skin infection.  I sent 2 antibiotics to your pharmacy.  Please take these as directed.  You can take Tylenol  and ibuprofen  to help with pain.  Please return to the ER if you develop significantly worsening rash, or not having improvement in 48 hours, or develop any other new or concerning symptoms.  Otherwise, please follow-up with her primary care doctor for further evaluation.

## 2023-12-31 LAB — CULTURE, BLOOD (ROUTINE X 2)
Culture: NO GROWTH
Culture: NO GROWTH
Special Requests: ADEQUATE
Special Requests: ADEQUATE
